# Patient Record
Sex: Male | Born: 1970 | Race: White | Hispanic: No | Marital: Married | Smoking: Current every day smoker
Health system: Southern US, Community
[De-identification: ages and names within clinical notes are randomized; demographics above are authoritative.]

## PROBLEM LIST (undated history)

## (undated) DIAGNOSIS — N200 Calculus of kidney: Secondary | ICD-10-CM

## (undated) HISTORY — PX: APPENDECTOMY: SHX54

## (undated) HISTORY — PX: ROTATOR CUFF REPAIR: SHX139

---

## 2017-01-02 ENCOUNTER — Emergency Department (HOSPITAL_BASED_OUTPATIENT_CLINIC_OR_DEPARTMENT_OTHER)
Admission: EM | Admit: 2017-01-02 | Discharge: 2017-01-02 | Disposition: A | Discharge status: Home / Self Care | Payer: Self-pay | Attending: Emergency Medicine | Admitting: Emergency Medicine

## 2017-01-02 ENCOUNTER — Encounter (HOSPITAL_BASED_OUTPATIENT_CLINIC_OR_DEPARTMENT_OTHER): Payer: Self-pay

## 2017-01-02 ENCOUNTER — Emergency Department (HOSPITAL_BASED_OUTPATIENT_CLINIC_OR_DEPARTMENT_OTHER): Payer: Self-pay

## 2017-01-02 DIAGNOSIS — Y999 Unspecified external cause status: Secondary | ICD-10-CM | POA: Insufficient documentation

## 2017-01-02 DIAGNOSIS — Y929 Unspecified place or not applicable: Secondary | ICD-10-CM | POA: Insufficient documentation

## 2017-01-02 DIAGNOSIS — W228XXA Striking against or struck by other objects, initial encounter: Secondary | ICD-10-CM | POA: Insufficient documentation

## 2017-01-02 DIAGNOSIS — F17210 Nicotine dependence, cigarettes, uncomplicated: Secondary | ICD-10-CM | POA: Insufficient documentation

## 2017-01-02 DIAGNOSIS — S62502A Fracture of unspecified phalanx of left thumb, initial encounter for closed fracture: Secondary | ICD-10-CM | POA: Insufficient documentation

## 2017-01-02 DIAGNOSIS — Y9389 Activity, other specified: Secondary | ICD-10-CM | POA: Insufficient documentation

## 2017-01-02 HISTORY — DX: Calculus of kidney: N20.0

## 2017-01-02 NOTE — Discharge Instructions (Signed)
Wear splint for comfort for the next several days, then slowly reintroduce activity.  Ice for 20 minutes every 2 hours while awake for the next 2 days.  Follow-up with hand surgery if not improving in the next week. The contact information for Dr. Merlyn LotKuzma has been provided in this discharge summary for you to call and make these arrangements.

## 2017-01-02 NOTE — ED Provider Notes (Signed)
[facility] Provider Note   CSN: [id number] Arrival date & time: 01/02/17  2107 By signing my name below, I, Levon Hedger, attest that this documentation has been prepared under the direction and in the presence of Geoffery Lyons, MD . Electronically Signed: Levon Hedger, Scribe. 01/02/2017. 11:24 PM.   History   Chief Complaint Chief Complaint  Patient presents with  . Finger Injury   HPI Nathaniel Solis is a 46 y.o. male who presents to the Emergency Department complaining of sudden onset, constant pain to left thumb after striking thumb with a hammer. Pt was working on his car when he accidentally struck his left thumb. Thumb pain is exacerbated by movement; he reports limited ROM due to pain. No alleviating or modifying factors noted.  Pt has no other acute complaints or associated symptoms at this time.    The history is provided by the patient. No language interpreter was used.   Past Medical History:  Diagnosis Date  . Kidney stone     There are no active problems to display for this patient.   Past Surgical History:  Procedure Laterality Date  . APPENDECTOMY      Home Medications    Prior to Admission medications   Not on File    Family History No family history on file.  Social History Social History  Substance Use Topics  . Smoking status: Current Every Day Smoker    Types: Cigarettes  . Smokeless tobacco: Never Used  . Alcohol use No    Allergies   Doxycycline  Review of Systems Review of Systems All systems reviewed and are negative for acute change except as noted in the HPI.  Physical Exam Updated Vital Signs BP 122/69 (BP Location: Left Arm)   Pulse 80   Temp 99.1 F (37.3 C) (Oral)   Resp 18   Ht 5\' 10"  (1.778 m)   Wt 188 lb (85.3 kg)   SpO2 98%   BMI 26.98 kg/m   Physical Exam  Constitutional: He is oriented to person, place, and time. He appears well-developed and well-nourished.  HENT:  Head: Normocephalic.  Eyes:  EOM are normal.  Neck: Normal range of motion.  Pulmonary/Chest: Effort normal.  Abdominal: He exhibits no distension.  Musculoskeletal: Normal range of motion.  Mild swelling and TTP over the MCP joint above the left thumb.Good ROM. Flexor and extensor tendons intact.   Neurological: He is alert and oriented to person, place, and time.  Psychiatric: He has a normal mood and affect.  Nursing note and vitals reviewed.  ED Treatments / Results  DIAGNOSTIC STUDIES:  Oxygen Saturation is 98% on RA, normal by my interpretation.    COORDINATION OF CARE:  11:25 PM Discussed treatment plan with pt at bedside and pt agreed to plan.   Labs (all labs ordered are listed, but only abnormal results are displayed) Labs Reviewed - No data to display  EKG  EKG Interpretation None       Radiology Dg Finger Thumb Left  Result Date: 01/02/2017 CLINICAL DATA:  Hammer injury to left thumb yesterday. Pain and swelling. EXAM: LEFT THUMB 2+V COMPARISON:  None. FINDINGS: No gross fracture. No subluxation or dislocation. The lateral view shows some cortical irregularity along the volar are base of the proximal phalanx. There are some degenerative changes in this joint in the findings could be related to spurring although a nondisplaced corner fracture at the base the proximal phalanx could potentially have this appearance. IMPRESSION: Possible tiny corner fracture  at the base of the proximal phalanx. This is seen on only one view in there are degenerative changes in this joint and the appearance could be related to spurring. Electronically Signed   By: Kennith CenterEric  Mansell M.D.   On: 01/02/2017 21:38    Procedures Procedures (including critical care time)  Medications Ordered in ED Medications - No data to display   Initial Impression / Assessment and Plan / ED Course  I have reviewed the triage vital signs and the nursing notes.  Pertinent labs & imaging results that were available during my care of the  patient were reviewed by me and considered in my medical decision making (see chart for details).  X-rays suggesting a small avulsion fracture, however no other acute findings. He will be placed in a thumb spica splint and is to follow-up with hand surgery if not improving in the next week.  Final Clinical Impressions(s) / ED Diagnoses   Final diagnoses:  None    New Prescriptions New Prescriptions   No medications on file   I personally performed the services described in this documentation, which was scribed in my presence. The recorded information has been reviewed and is accurate.        Geoffery Lyonselo, Dustine Bertini, MD 01/03/17 0200

## 2017-01-02 NOTE — ED Triage Notes (Signed)
Pt states he hit left thumb with hammer yesterday-no break in skin noted-NAD-steady gait

## 2018-03-14 ENCOUNTER — Other Ambulatory Visit: Payer: Self-pay

## 2018-03-14 ENCOUNTER — Emergency Department (HOSPITAL_BASED_OUTPATIENT_CLINIC_OR_DEPARTMENT_OTHER): Payer: Self-pay

## 2018-03-14 ENCOUNTER — Emergency Department (HOSPITAL_COMMUNITY): Payer: Self-pay

## 2018-03-14 ENCOUNTER — Emergency Department (HOSPITAL_BASED_OUTPATIENT_CLINIC_OR_DEPARTMENT_OTHER)
Admission: EM | Admit: 2018-03-14 | Discharge: 2018-03-14 | Disposition: A | Discharge status: Home / Self Care | Payer: Self-pay | Attending: Emergency Medicine | Admitting: Emergency Medicine

## 2018-03-14 ENCOUNTER — Encounter (HOSPITAL_BASED_OUTPATIENT_CLINIC_OR_DEPARTMENT_OTHER): Payer: Self-pay | Admitting: Emergency Medicine

## 2018-03-14 DIAGNOSIS — M25541 Pain in joints of right hand: Secondary | ICD-10-CM | POA: Insufficient documentation

## 2018-03-14 DIAGNOSIS — Y93H9 Activity, other involving exterior property and land maintenance, building and construction: Secondary | ICD-10-CM | POA: Insufficient documentation

## 2018-03-14 DIAGNOSIS — M7021 Olecranon bursitis, right elbow: Secondary | ICD-10-CM | POA: Insufficient documentation

## 2018-03-14 DIAGNOSIS — T1490XA Injury, unspecified, initial encounter: Secondary | ICD-10-CM

## 2018-03-14 DIAGNOSIS — W228XXA Striking against or struck by other objects, initial encounter: Secondary | ICD-10-CM | POA: Insufficient documentation

## 2018-03-14 DIAGNOSIS — Y999 Unspecified external cause status: Secondary | ICD-10-CM | POA: Insufficient documentation

## 2018-03-14 DIAGNOSIS — F17210 Nicotine dependence, cigarettes, uncomplicated: Secondary | ICD-10-CM | POA: Insufficient documentation

## 2018-03-14 MED ORDER — NAPROXEN 500 MG PO TABS: 500.0000 mg | ORAL_TABLET | Freq: Two times a day (BID) | ORAL | 0 refills | Status: DC

## 2018-03-14 MED ORDER — KETOROLAC TROMETHAMINE 30 MG/ML IJ SOLN
30.0000 mg | Freq: Once | INTRAMUSCULAR | Status: AC
  Administered 2018-03-14: 30 mg via INTRAMUSCULAR
  Filled 2018-03-14: qty 1

## 2018-03-14 NOTE — ED Notes (Signed)
Patient transported to X-ray 

## 2018-03-14 NOTE — Discharge Instructions (Signed)
Return to ED for worsening symptoms, worsening swelling, additional injuries or falls, red hot or tender joint with fever or trouble moving your arm.

## 2018-03-14 NOTE — ED Provider Notes (Signed)
[facility] EMERGENCY DEPARTMENT Provider Note   CSN: [id number] Arrival date & time: 03/14/18  7829     History   Chief Complaint Chief Complaint  Patient presents with  . Hand Injury  . Elbow Pain    HPI Nathaniel Solis is a 47 y.o. male who presents to ED for evaluation of 3-week history of right elbow pain and right third digit MCP joint pain.  Symptoms began when he injured himself on his lawnmower 3 weeks ago.  States that he actually went over a sink hole.  He has had progressive worsening pain and swelling since then.  He has not been taking any medication at home to help with the symptoms.  Denies any history of gout or septic joint, fever, additional injuries or falls, numbness in arms or legs, other injuries.  HPI  Past Medical History:  Diagnosis Date  . Kidney stone     There are no active problems to display for this patient.   Past Surgical History:  Procedure Laterality Date  . APPENDECTOMY          Home Medications    Prior to Admission medications   Medication Sig Start Date End Date Taking? Authorizing Provider  naproxen (NAPROSYN) 500 MG tablet Take 1 tablet (500 mg total) by mouth 2 (two) times daily. 03/14/18   Dietrich Pates, PA-C    Family History History reviewed. No pertinent family history.  Social History Social History   Tobacco Use  . Smoking status: Current Every Day Smoker    Types: Cigarettes  . Smokeless tobacco: Never Used  Substance Use Topics  . Alcohol use: No  . Drug use: No     Allergies   Doxycycline   Review of Systems Review of Systems  Constitutional: Negative for chills and fever.  Musculoskeletal: Positive for arthralgias and joint swelling. Negative for back pain, gait problem, myalgias, neck pain and neck stiffness.  Neurological: Negative for weakness and numbness.     Physical Exam Updated Vital Signs BP (!) 153/88 (BP Location: Left Arm)   Pulse 78   Temp 98 F (36.7 C) (Oral)   Resp  16   Ht 5\' 10"  (1.778 m)   Wt 86.2 kg   SpO2 98%   BMI 27.26 kg/m   Physical Exam  Constitutional: He appears well-developed and well-nourished. No distress.  HENT:  Head: Normocephalic and atraumatic.  Eyes: Conjunctivae and EOM are normal. No scleral icterus.  Neck: Normal range of motion.  Pulmonary/Chest: Effort normal. No respiratory distress.  Musculoskeletal: Normal range of motion. He exhibits edema. He exhibits no tenderness or deformity.  Full active and passive range of motion of right elbow, wrist and digits. Edema noted of the R elbow (at olecranon) with no overlying skin changes or warmth noted.  2+ radial pulse palpated.  Neurological: He is alert.  Skin: No rash noted. He is not diaphoretic.  Psychiatric: He has a normal mood and affect.  Nursing note and vitals reviewed.    ED Treatments / Results  Labs (all labs ordered are listed, but only abnormal results are displayed) Labs Reviewed - No data to display  EKG None  Radiology Dg Elbow Complete Right (3+view)  Result Date: 03/14/2018 CLINICAL DATA:  Pain after trauma 3 weeks ago EXAM: RIGHT ELBOW - COMPLETE 3+ VIEW COMPARISON:  None. FINDINGS: Swelling over the olecranon. No underlying fracture. No joint effusion. No other abnormalities. IMPRESSION: Probable olecranon bursitis with soft tissue swelling in this region. No fractures.  Electronically Signed   By: Gerome Sam III M.D   On: 03/14/2018 10:54   Dg Hand Complete Right  Result Date: 03/14/2018 CLINICAL DATA:  Trauma to elbow 3 weeks ago with continued pain and swelling. Old trauma to index finger. Pain in the third and fourth MCP joints. EXAM: RIGHT HAND - COMPLETE 3+ VIEW COMPARISON:  None. FINDINGS: Amputation of the distal index finger. Degenerative changes at the first interphalangeal joint. Mild bowing of the fifth metacarpal may represent sequela of remote trauma. No acute fractures are seen. IMPRESSION: No acute fractures.  Distal index finger  remote amputation. Electronically Signed   By: Gerome Sam III M.D   On: 03/14/2018 10:53    Procedures Procedures (including critical care time)  Medications Ordered in ED Medications  ketorolac (TORADOL) 30 MG/ML injection 30 mg (has no administration in time range)     Initial Impression / Assessment and Plan / ED Course  I have reviewed the triage vital signs and the nursing notes.  Pertinent labs & imaging results that were available during my care of the patient were reviewed by me and considered in my medical decision making (see chart for details).     47 year old male presents to ED for 3-week history of right elbow pain and hand pain.  He was on his lawnmower when he went into a syncopal and bumped his hand and elbow on the side of the lawnmower.  He has had pain and swelling since then.  He has not taken any medicine help with his symptoms.  No prior injuries noted.  On exam there is edema of the olecranon bursa with no overlying skin changes or changes to range of motion noted.  Tenderness palpation over the third MCP joint of the right hand.  No skin changes noted and here as well.  X-ray shows olecranon bursitis of the right elbow and negative right hand x-ray.  Will give patient anti-inflammatories, sling for comfort and orthopedic follow-up.  I do not feel that there is any infectious or vascular cause of his symptoms.  Will advise him to return to ED for any severe worsening symptoms.  Portions of this note were generated with Scientist, clinical (histocompatibility and immunogenetics). Dictation errors may occur despite best attempts at proofreading.   Final Clinical Impressions(s) / ED Diagnoses   Final diagnoses:  Olecranon bursitis of right elbow    ED Discharge Orders         Ordered    naproxen (NAPROSYN) 500 MG tablet  2 times daily     03/14/18 1116           Dietrich Pates, PA-C 03/14/18 1116    Azalia Bilis, MD 03/14/18 1405

## 2018-03-14 NOTE — ED Triage Notes (Signed)
Patient states that he hit his right elbow and hand about 3 weeks ago and now has continued pain and swelling

## 2018-07-26 ENCOUNTER — Other Ambulatory Visit: Payer: Self-pay

## 2018-07-26 ENCOUNTER — Encounter (HOSPITAL_BASED_OUTPATIENT_CLINIC_OR_DEPARTMENT_OTHER): Payer: Self-pay | Admitting: *Deleted

## 2018-07-26 ENCOUNTER — Emergency Department (HOSPITAL_BASED_OUTPATIENT_CLINIC_OR_DEPARTMENT_OTHER)
Admission: EM | Admit: 2018-07-26 | Discharge: 2018-07-26 | Disposition: A | Discharge status: Home / Self Care | Payer: Medicaid Other | Attending: Emergency Medicine | Admitting: Emergency Medicine

## 2018-07-26 DIAGNOSIS — F17210 Nicotine dependence, cigarettes, uncomplicated: Secondary | ICD-10-CM | POA: Insufficient documentation

## 2018-07-26 DIAGNOSIS — L989 Disorder of the skin and subcutaneous tissue, unspecified: Secondary | ICD-10-CM | POA: Insufficient documentation

## 2018-07-26 DIAGNOSIS — Z79899 Other long term (current) drug therapy: Secondary | ICD-10-CM | POA: Insufficient documentation

## 2018-07-26 NOTE — Discharge Instructions (Signed)
Please try over the counter wart removal Please establish care with a primary doctor

## 2018-07-26 NOTE — ED Provider Notes (Signed)
[facility] EMERGENCY DEPARTMENT Provider Note   CSN: [id number] Arrival date & time: 07/26/18  1936     History   Chief Complaint Chief Complaint  Patient presents with  . Nevus    HPI Nathaniel Solis is a 48 y.o. male who presents with a skin lesion.  No significant past medical history.  Patient states that he developed a painful skin lesion on the right middle aspect of his back several weeks ago.  The area is tender.  He thought it might of been a boil that needs to be lanced.  He had his significant other look at it and was told that it looks like a mole.  He came here for an evaluation because he does not know what it is.  It just popped up over the past couple weeks. He reports family hx of skin cancer. Nothing makes it better or worse.  HPI  Past Medical History:  Diagnosis Date  . Kidney stone     There are no active problems to display for this patient.   Past Surgical History:  Procedure Laterality Date  . APPENDECTOMY          Home Medications    Prior to Admission medications   Medication Sig Start Date End Date Taking? Authorizing Provider  naproxen (NAPROSYN) 500 MG tablet Take 1 tablet (500 mg total) by mouth 2 (two) times daily. 03/14/18   Dietrich Pates, PA-C    Family History No family history on file.  Social History Social History   Tobacco Use  . Smoking status: Current Every Day Smoker    Types: Cigarettes  . Smokeless tobacco: Never Used  Substance Use Topics  . Alcohol use: No  . Drug use: No     Allergies   Doxycycline   Review of Systems Review of Systems  Constitutional: Negative for fever.  Skin:       +lesion     Physical Exam Updated Vital Signs BP 122/76 (BP Location: Right Arm)   Pulse 78   Temp 98.2 F (36.8 C) (Oral)   Resp 16   Ht 5\' 10"  (1.778 m)   Wt 93 kg   SpO2 98%   BMI 29.41 kg/m   Physical Exam Vitals signs and nursing note reviewed.  Constitutional:      General: He is not in  acute distress.    Appearance: Normal appearance. He is well-developed.     Comments: Chronically ill-appearing.  Appears older than stated age  HENT:     Head: Normocephalic and atraumatic.  Eyes:     General: No scleral icterus.       Right eye: No discharge.        Left eye: No discharge.     Conjunctiva/sclera: Conjunctivae normal.     Pupils: Pupils are equal, round, and reactive to light.  Neck:     Musculoskeletal: Normal range of motion.  Cardiovascular:     Rate and Rhythm: Normal rate.  Pulmonary:     Effort: Pulmonary effort is normal. No respiratory distress.  Abdominal:     General: There is no distension.  Skin:    General: Skin is warm and dry.     Comments: .5cm round, brown lesion consistent with wart vs irritated mole  Neurological:     Mental Status: He is alert and oriented to person, place, and time.  Psychiatric:        Behavior: Behavior normal.      ED Treatments /  Results  Labs (all labs ordered are listed, but only abnormal results are displayed) Labs Reviewed - No data to display  EKG None  Radiology No results found.  Procedures Procedures (including critical care time)  Medications Ordered in ED Medications - No data to display   Initial Impression / Assessment and Plan / ED Course  I have reviewed the triage vital signs and the nursing notes.  Pertinent labs & imaging results that were available during my care of the patient were reviewed by me and considered in my medical decision making (see chart for details).  48 year old male presents with a skin lesion for the past couple of weeks.  It is tender and is bothering him.  He has several other moles on his back which do not look similar.  I think most likely it is a wart.  Possibly irritated mole.  He was encouraged to try over-the-counter wart medication and follow-up with a primary care provider.  Final Clinical Impressions(s) / ED Diagnoses   Final diagnoses:  Skin lesion     ED Discharge Orders    None       Bethel BornGekas, Othella Slappey Marie, PA-C 07/27/18 0001    Alvira MondaySchlossman, Erin, MD 07/29/18 [phone]

## 2018-07-26 NOTE — ED Triage Notes (Signed)
He has a mole on his back that hurts.

## 2019-03-07 ENCOUNTER — Emergency Department (HOSPITAL_BASED_OUTPATIENT_CLINIC_OR_DEPARTMENT_OTHER)
Admission: EM | Admit: 2019-03-07 | Discharge: 2019-03-07 | Disposition: A | Discharge status: Home / Self Care | Payer: Medicaid Other | Attending: Emergency Medicine | Admitting: Emergency Medicine

## 2019-03-07 ENCOUNTER — Encounter (HOSPITAL_BASED_OUTPATIENT_CLINIC_OR_DEPARTMENT_OTHER): Payer: Self-pay | Admitting: *Deleted

## 2019-03-07 ENCOUNTER — Emergency Department (HOSPITAL_BASED_OUTPATIENT_CLINIC_OR_DEPARTMENT_OTHER): Payer: Medicaid Other

## 2019-03-07 ENCOUNTER — Other Ambulatory Visit: Payer: Self-pay

## 2019-03-07 DIAGNOSIS — Y929 Unspecified place or not applicable: Secondary | ICD-10-CM | POA: Diagnosis not present

## 2019-03-07 DIAGNOSIS — W01198A Fall on same level from slipping, tripping and stumbling with subsequent striking against other object, initial encounter: Secondary | ICD-10-CM | POA: Insufficient documentation

## 2019-03-07 DIAGNOSIS — F17210 Nicotine dependence, cigarettes, uncomplicated: Secondary | ICD-10-CM | POA: Insufficient documentation

## 2019-03-07 DIAGNOSIS — S0990XA Unspecified injury of head, initial encounter: Secondary | ICD-10-CM

## 2019-03-07 DIAGNOSIS — Z79899 Other long term (current) drug therapy: Secondary | ICD-10-CM | POA: Diagnosis not present

## 2019-03-07 DIAGNOSIS — Y9389 Activity, other specified: Secondary | ICD-10-CM | POA: Diagnosis not present

## 2019-03-07 DIAGNOSIS — S060X1A Concussion with loss of consciousness of 30 minutes or less, initial encounter: Secondary | ICD-10-CM | POA: Diagnosis not present

## 2019-03-07 DIAGNOSIS — Y999 Unspecified external cause status: Secondary | ICD-10-CM | POA: Diagnosis not present

## 2019-03-07 NOTE — ED Triage Notes (Signed)
He was working on a car when he tripped and fell. He hit the right side of his head. Positive LOC. He has a headache and a hematoma.

## 2019-03-07 NOTE — ED Notes (Signed)
Pt. Reports he had a fall today into a concrete pole hitting the R side of his head.  Pt. Reports the fall happened at approx. 5:30 and he did loose consciousness for approx. A min.  Pt. Is acutely aware of how this happened and has GCS of 15 at present time.  Wife at bedside.

## 2019-03-07 NOTE — Discharge Instructions (Signed)
Your imaging today showed no acute fracture, intracranial hemorrhage, or other abnormality.  We suspect you have a concussion based on the injury.  Please rest and stay hydrated.  Please follow-up with your primary doctor.  If any symptoms change or worsen, please return to the nearest emergency department.

## 2019-03-07 NOTE — ED Notes (Signed)
Pt. Standing at the door asking about his discharge papers.   Pt. States he is ready to leave.

## 2019-03-07 NOTE — ED Provider Notes (Signed)
[name] EMERGENCY DEPARTMENT Provider Note   CSN: [id number] Arrival date & time: 03/07/19  2027     History   Chief Complaint Chief Complaint  Patient presents with  . Fall  . Head Injury    HPI Nathaniel Solis is a 48 y.o. male who presents for evaluation of right-sided head pain after mechanical fall that occurred about 5 PM this evening.  Patient reports that he was working on a car and stepped up to go do something and reports that he tripped, causing him to fall.  He reports he hit his right side of his head on a concrete pole.  He states that he had loss of consciousness for approximately a minute.  Patient states he is not currently on blood thinners.  He states that he is not any numbness/weakness, difficulty walking, nausea/vomiting since the incident.  Patient states that he started having a headache on the right side of the head and felt tenderness palpation noted so he came to the emergency department.  He denies any vision changes, numbness/weakness of his arms or legs, preceding chest pain or dizziness, neck pain, vomiting.     The history is provided by the patient.    Past Medical History:  Diagnosis Date  . Kidney stone     There are no active problems to display for this patient.   Past Surgical History:  Procedure Laterality Date  . APPENDECTOMY          Home Medications    Prior to Admission medications   Medication Sig Start Date End Date Taking? Authorizing Provider  Methocarbamol (ROBAXIN PO) Take by mouth.   Yes [provider]  naproxen (NAPROSYN) 500 MG tablet Take 1 tablet (500 mg total) by mouth 2 (two) times daily. 03/14/18  Yes Khatri, Hina, PA-C    Family History No family history on file.  Social History Social History   Tobacco Use  . Smoking status: Current Every Day Smoker    Types: Cigarettes  . Smokeless tobacco: Never Used  Substance Use Topics  . Alcohol use: No  . Drug use: No     Allergies    Doxycycline   Review of Systems Review of Systems  Constitutional: Negative for fever.  Eyes: Negative for visual disturbance.  Respiratory: Negative for cough and shortness of breath.   Cardiovascular: Negative for chest pain.  Gastrointestinal: Negative for abdominal pain, nausea and vomiting.  Musculoskeletal: Negative for neck pain.  Neurological: Positive for headaches. Negative for dizziness, weakness and numbness.  All other systems reviewed and are negative.    Physical Exam Updated Vital Signs BP 131/90 (BP Location: Left Arm)   Pulse 81   Temp 98.8 F (37.1 C) (Oral)   Resp 16   Ht 5\' 10"  (1.778 m)   Wt 86.2 kg   SpO2 98%   BMI 27.26 kg/m   Physical Exam Vitals signs and nursing note reviewed.  Constitutional:      Appearance: Normal appearance. He is well-developed.  HENT:     Head: Normocephalic and atraumatic.      Comments: Tenderness palpation noted to right temporal region.  There is small area that it feels slightly depressed.  There is an overlying hematoma noted.  No laceration or wound. Eyes:     General: Lids are normal.     Conjunctiva/sclera: Conjunctivae normal.     Pupils: Pupils are equal, round, and reactive to light.     Comments: PERRL. EOMs intact. No nystagmus.  No neglect.   Neck:     Musculoskeletal: Full passive range of motion without pain.     Comments: Full flexion/extension and lateral movement of neck fully intact. No bony midline tenderness. No deformities or crepitus.  Cardiovascular:     Rate and Rhythm: Normal rate and regular rhythm.     Pulses: Normal pulses.     Heart sounds: Normal heart sounds. No murmur. No friction rub. No gallop.   Pulmonary:     Effort: Pulmonary effort is normal.     Breath sounds: Normal breath sounds.  Abdominal:     Palpations: Abdomen is soft. Abdomen is not rigid.     Tenderness: There is no abdominal tenderness. There is no guarding.  Musculoskeletal: Normal range of motion.  Skin:     General: Skin is warm and dry.     Capillary Refill: Capillary refill takes less than 2 seconds.  Neurological:     Mental Status: He is alert and oriented to person, place, and time.     Comments: Cranial nerves III-XII intact Follows commands, Moves all extremities  5/5 strength to BUE and BLE  Sensation intact throughout all major nerve distributions Normal coordination No pronator drift. No gait abnormalities  No slurred speech. No facial droop.   Psychiatric:        Speech: Speech normal.      ED Treatments / Results  Labs (all labs ordered are listed, but only abnormal results are displayed) Labs Reviewed - No data to display  EKG None  Radiology Ct Head Wo Contrast  Result Date: 03/07/2019 CLINICAL DATA:  Head trauma, headache EXAM: CT HEAD WITHOUT CONTRAST TECHNIQUE: Contiguous axial images were obtained from the base of the skull through the vertex without intravenous contrast. COMPARISON:  None. FINDINGS: Brain: No acute intracranial abnormality. Specifically, no hemorrhage, hydrocephalus, mass lesion, acute infarction, or significant intracranial injury. Vascular: No hyperdense vessel or unexpected calcification. Skull: No acute calvarial abnormality. Sinuses/Orbits: Visualized paranasal sinuses and mastoids clear. Orbital soft tissues unremarkable. Other: None IMPRESSION: Normal study. Electronically Signed   By: Charlett NoseKevin  Dover M.D.   On: 03/07/2019 21:59    Procedures Procedures (including critical care time)  Medications Ordered in ED Medications - No data to display   Initial Impression / Assessment and Plan / ED Course  I have reviewed the triage vital signs and the nursing notes.  Pertinent labs & imaging results that were available during my care of the patient were reviewed by me and considered in my medical decision making (see chart for details).        48 year old male who presents for evaluation of head injury after mechanical fall.  Reports that  he tripped and hit his head on a concrete pole.  Positive LOC.  No preceding chest pain or dizziness.  Reports pain to right side of his head since then.  He is not currently on blood thinners.  Denies any numbness/weakness, nausea/vomiting. Patient is afebrile, non-toxic appearing, sitting comfortably on examination table. Vital signs reviewed and stable.  No neuro deficits noted on exam.  He does have tenderness palpation of the right temporal region with a small indentation.  Will plan for CT head given LOC and tenderness palpation of the right temple region.  Patient signed out to Dr. Rush Landmarkegeler with head CT pending.  Portions of this note were generated with Scientist, clinical (histocompatibility and immunogenetics)Dragon dictation software. Dictation errors may occur despite best attempts at proofreading.   Final Clinical Impressions(s) / ED Diagnoses   Final  diagnoses:  Injury of head, initial encounter  Concussion with loss of consciousness of 30 minutes or less, initial encounter    ED Discharge Orders    None       Rosana Hoes 03/08/19 2210    Tegeler, Canary Brim, MD 03/09/19 1349

## 2021-02-21 ENCOUNTER — Emergency Department (HOSPITAL_BASED_OUTPATIENT_CLINIC_OR_DEPARTMENT_OTHER)
Admission: EM | Admit: 2021-02-21 | Discharge: 2021-02-21 | Disposition: A | Discharge status: Home / Self Care | Payer: Medicaid Other | Attending: Emergency Medicine | Admitting: Emergency Medicine

## 2021-02-21 ENCOUNTER — Other Ambulatory Visit: Payer: Self-pay

## 2021-02-21 ENCOUNTER — Encounter (HOSPITAL_BASED_OUTPATIENT_CLINIC_OR_DEPARTMENT_OTHER): Payer: Self-pay | Admitting: *Deleted

## 2021-02-21 ENCOUNTER — Emergency Department (HOSPITAL_BASED_OUTPATIENT_CLINIC_OR_DEPARTMENT_OTHER): Payer: Medicaid Other

## 2021-02-21 DIAGNOSIS — R29810 Facial weakness: Secondary | ICD-10-CM | POA: Diagnosis not present

## 2021-02-21 DIAGNOSIS — F17210 Nicotine dependence, cigarettes, uncomplicated: Secondary | ICD-10-CM | POA: Diagnosis not present

## 2021-02-21 DIAGNOSIS — H509 Unspecified strabismus: Secondary | ICD-10-CM | POA: Diagnosis not present

## 2021-02-21 DIAGNOSIS — H5000 Unspecified esotropia: Secondary | ICD-10-CM

## 2021-02-21 NOTE — Discharge Instructions (Addendum)
Follow-up with your primary care doctor for recheck.  Your CT today is normal.

## 2021-02-21 NOTE — ED Notes (Signed)
Patient reports brief episode of facial numbness and "crossed eyes" ~ 1245 today, now resolved.  Denies dizziness, headache, weakness, vision changes or any other neurological symptoms.

## 2021-02-21 NOTE — ED Provider Notes (Signed)
[facility] EMERGENCY DEPARTMENT Provider Note   CSN: [id number] Arrival date & time: 02/21/21  1338     History Chief Complaint  Patient presents with   Numbness    Nathaniel Solis is a 50 y.o. male.  50 year old male with remote history of prostate mass, removed, urethral stricture with foley placed yesterday present today with feeling jittery with report of brief episode of "eye jumping with crossing of the left eye" followed by left side facial numbness.  Episode lasted a few seconds and completely resolved.  Denies difficulty with speech or gait.  Patient was able to talk to his significant other throughout the event, she is at bedside and reports "crossing of his left eye and jumping of the right eye." Patient went his urologist office this morning, prior to this episode for obstructed foley, had uncomplicated irrigation.      Past Medical History:  Diagnosis Date   Kidney stone     There are no problems to display for this patient.   Past Surgical History:  Procedure Laterality Date   APPENDECTOMY         No family history on file.  Social History   Tobacco Use   Smoking status: Every Day    Packs/day: 0.50    Types: Cigarettes   Smokeless tobacco: Never  Substance Use Topics   Alcohol use: No   Drug use: No    Home Medications Prior to Admission medications   Medication Sig Start Date End Date Taking? Authorizing Provider  Methocarbamol (ROBAXIN PO) Take by mouth.    [provider]  naproxen (NAPROSYN) 500 MG tablet Take 1 tablet (500 mg total) by mouth 2 (two) times daily. 03/14/18   Khatri, Hina, PA-C    Allergies    Doxycycline  Review of Systems   Review of Systems  Constitutional:  Negative for fever.  Eyes:  Negative for visual disturbance.  Gastrointestinal:  Negative for nausea and vomiting.  Musculoskeletal:  Negative for arthralgias, gait problem and myalgias.  Skin:  Negative for rash and wound.   Allergic/Immunologic: Negative for immunocompromised state.  Neurological:  Positive for numbness. Negative for dizziness, weakness and headaches.  Psychiatric/Behavioral:  Negative for confusion.   All other systems reviewed and are negative.  Physical Exam Updated Vital Signs BP 109/80 (BP Location: Right Arm)   Pulse (!) 56   Temp 98.6 F (37 C) (Oral)   Resp 18   Ht 5\' 10"  (1.778 m)   Wt 86.2 kg   SpO2 96%   BMI 27.26 kg/m   Physical Exam Vitals and nursing note reviewed.  Constitutional:      General: He is not in acute distress.    Appearance: He is well-developed. He is not diaphoretic.  HENT:     Head: Normocephalic and atraumatic.     Nose: Nose normal.     Mouth/Throat:     Mouth: Mucous membranes are moist.     Pharynx: Oropharynx is clear.  Eyes:     Extraocular Movements: Extraocular movements intact.     Pupils: Pupils are equal, round, and reactive to light.  Cardiovascular:     Rate and Rhythm: Normal rate and regular rhythm.     Heart sounds: Normal heart sounds.  Pulmonary:     Effort: Pulmonary effort is normal.     Breath sounds: Normal breath sounds.  Musculoskeletal:     Cervical back: Neck supple.     Right lower leg: No edema.  Left lower leg: No edema.  Skin:    General: Skin is warm and dry.     Findings: No erythema or rash.  Neurological:     Mental Status: He is alert and oriented to person, place, and time.     GCS: GCS eye subscore is 4. GCS verbal subscore is 5. GCS motor subscore is 6.     Cranial Nerves: No cranial nerve deficit.     Sensory: Sensation is intact. No sensory deficit.     Motor: Motor function is intact. No weakness or pronator drift.     Coordination: Coordination normal. Heel to Shin Test normal.  Psychiatric:        Behavior: Behavior normal.    ED Results / Procedures / Treatments   Labs (all labs ordered are listed, but only abnormal results are displayed) Labs Reviewed - No data to  display  EKG None  Radiology CT Head Wo Contrast  Result Date: 02/21/2021 CLINICAL DATA:  TIA EXAM: CT HEAD WITHOUT CONTRAST TECHNIQUE: Contiguous axial images were obtained from the base of the skull through the vertex without intravenous contrast. COMPARISON:  CT head 03/07/2019 FINDINGS: Brain: There is no acute intracranial hemorrhage, extra-axial fluid collection, or infarct. The ventricles are normal in size. There is no mass lesion. There is no midline shift. Vascular: No hyperdense vessel or unexpected calcification. Skull: Normal. Negative for fracture or focal lesion. Sinuses/Orbits: There is a small retention cyst in the right sphenoid sinus. A defect in the right lamina papyracea likely reflects prior trauma. The imaged globes and orbits are unremarkable. Other: None. IMPRESSION: No acute intracranial pathology. Electronically Signed   By: Lesia Hausen M.D.   On: 02/21/2021 15:45    Procedures Procedures   Medications Ordered in ED Medications - No data to display  ED Course  I have reviewed the triage vital signs and the nursing notes.  Pertinent labs & imaging results that were available during my care of the patient were reviewed by me and considered in my medical decision making (see chart for details).  Clinical Course as of 02/21/21 1615  Thu Feb 21, 2021  [id number] 50 year old male presents with concern for a brief episode of eyes crossing and numbness to the left side of his face.  Exam normal at this time.  CT head is unremarkable.  Case discussed with Dr. Delford Field, ER attending, patient discharged to follow-up with primary care provider with return to ER precautions. [LM]    Clinical Course User Index [LM] Alden Hipp   MDM Rules/Calculators/A&P                           Final Clinical Impression(s) / ED Diagnoses Final diagnoses:  Cross-eye    Rx / DC Orders ED Discharge Orders     None        Jeannie Fend, PA-C 02/21/21 1615    Koleen Distance, MD 02/22/21 0730

## 2021-02-21 NOTE — ED Triage Notes (Signed)
C/o brief episode of left facial  numbness 1 hr ago lasting 4 mins

## 2021-03-11 ENCOUNTER — Encounter (HOSPITAL_BASED_OUTPATIENT_CLINIC_OR_DEPARTMENT_OTHER): Payer: Self-pay

## 2021-03-11 ENCOUNTER — Emergency Department (HOSPITAL_BASED_OUTPATIENT_CLINIC_OR_DEPARTMENT_OTHER): Payer: Medicaid Other

## 2021-03-11 ENCOUNTER — Emergency Department (HOSPITAL_BASED_OUTPATIENT_CLINIC_OR_DEPARTMENT_OTHER)
Admission: EM | Admit: 2021-03-11 | Discharge: 2021-03-11 | Disposition: A | Discharge status: Home / Self Care | Payer: Medicaid Other | Attending: Emergency Medicine | Admitting: Emergency Medicine

## 2021-03-11 ENCOUNTER — Other Ambulatory Visit: Payer: Self-pay

## 2021-03-11 DIAGNOSIS — W11XXXA Fall on and from ladder, initial encounter: Secondary | ICD-10-CM | POA: Insufficient documentation

## 2021-03-11 DIAGNOSIS — J449 Chronic obstructive pulmonary disease, unspecified: Secondary | ICD-10-CM | POA: Diagnosis not present

## 2021-03-11 DIAGNOSIS — M79641 Pain in right hand: Secondary | ICD-10-CM | POA: Diagnosis not present

## 2021-03-11 DIAGNOSIS — M25511 Pain in right shoulder: Secondary | ICD-10-CM | POA: Diagnosis present

## 2021-03-11 DIAGNOSIS — F17210 Nicotine dependence, cigarettes, uncomplicated: Secondary | ICD-10-CM | POA: Insufficient documentation

## 2021-03-11 DIAGNOSIS — M25531 Pain in right wrist: Secondary | ICD-10-CM | POA: Insufficient documentation

## 2021-03-11 DIAGNOSIS — W19XXXA Unspecified fall, initial encounter: Secondary | ICD-10-CM

## 2021-03-11 MED ORDER — KETOROLAC TROMETHAMINE 15 MG/ML IJ SOLN
15.0000 mg | Freq: Once | INTRAMUSCULAR | Status: AC
  Administered 2021-03-11: 15 mg via INTRAVENOUS
  Filled 2021-03-11: qty 1

## 2021-03-11 NOTE — ED Notes (Signed)
ED Provider at bedside. 

## 2021-03-11 NOTE — Discharge Instructions (Signed)
You were seen here today for right shoulder, wrist, and hand pain.  A referral to an orthopedic provider has been attached to the discharge paperwork you may take ibuprofen 800 mg every 8 hours as needed for pain.  Please rotate between heat and ice for pain and swelling.  The RICE method has been attached to this document.  If you have any concerns, or new or worsening symptoms, please return to the emergency department.

## 2021-03-11 NOTE — ED Triage Notes (Signed)
Pt reports he fell from a ladder yesterday-pain to right shoulder and right hand-NAD-steady gait-NAD

## 2021-03-11 NOTE — ED Provider Notes (Signed)
[facility] EMERGENCY DEPARTMENT Provider Note   CSN: [id number] Arrival date & time: 03/11/21  1438     History Chief Complaint  Patient presents with   Nathaniel Solis is a 50 y.o. male history of COPD presents to the ED complaining of right shoulder, wrist, hand pain after falling off a 5 foot ladder yesterday around 1900.  The patient mentions he fell backwards with wrist extended then landing on right shoulder . He reports that pain feels like a "bruise" with occasional sharpness.  Patient denies any blood thinner use or head injury.  He denies any neck or back pain.  Denies any chest pain or shortness of breath.  Denies any numbness, tingling, or weakness.  History includes left shoulder repair.  Daily medications include tizanidine and Lyrica.  He is a daily smoker, but denies any EtOH or drug use ever.  Allergic to doxycycline.   Fall Pertinent negatives include no chest pain, no abdominal pain and no shortness of breath.      Past Medical History:  Diagnosis Date   Kidney stone     There are no problems to display for this patient.   Past Surgical History:  Procedure Laterality Date   APPENDECTOMY         No family history on file.  Social History   Tobacco Use   Smoking status: Every Day    Packs/day: 0.50    Types: Cigarettes   Smokeless tobacco: Never  Vaping Use   Vaping Use: Never used  Substance Use Topics   Alcohol use: No   Drug use: No    Home Medications Prior to Admission medications   Medication Sig Start Date End Date Taking? Authorizing Provider  Methocarbamol (ROBAXIN PO) Take by mouth.    [provider]  naproxen (NAPROSYN) 500 MG tablet Take 1 tablet (500 mg total) by mouth 2 (two) times daily. 03/14/18   Khatri, Hina, PA-C    Allergies    Doxycycline  Review of Systems   Review of Systems  Constitutional:  Negative for chills and fever.  HENT:  Negative for ear pain and sore throat.   Eyes:   Negative for pain and visual disturbance.  Respiratory:  Negative for cough and shortness of breath.   Cardiovascular:  Negative for chest pain and palpitations.  Gastrointestinal:  Negative for abdominal pain and vomiting.  Genitourinary:  Negative for dysuria and hematuria.  Musculoskeletal:  Positive for arthralgias and myalgias. Negative for back pain and neck pain.  Skin:  Negative for color change and rash.  Neurological:  Negative for seizures, syncope, weakness and numbness.  All other systems reviewed and are negative.  Physical Exam Updated Vital Signs BP 139/80 (BP Location: Left Arm)   Pulse 71   Temp 98.9 F (37.2 C) (Oral)   Resp 18   Ht 5\' 10"  (1.778 m)   Wt 89.8 kg   SpO2 98%   BMI 28.41 kg/m   Physical Exam Vitals and nursing note reviewed.  Constitutional:      Appearance: Normal appearance.  HENT:     Head: Normocephalic and atraumatic.  Eyes:     General: No scleral icterus. Neck:     Comments: No midline C-spine tenderness.  No deformities or step-offs noted. Cardiovascular:     Rate and Rhythm: Normal rate and regular rhythm.     Comments: Radial pulses 2+ bilaterally.  Good cap refill. Pulmonary:     Effort: Pulmonary effort is  normal. No respiratory distress.     Breath sounds: Normal breath sounds.  Abdominal:     General: Abdomen is flat.     Palpations: Abdomen is soft.  Musculoskeletal:        General: Swelling and tenderness present. No deformity.     Cervical back: Normal range of motion. No tenderness.     Comments: Right - Patient has mild swelling to dorsum of right hand.  Tenderness over third knuckle with no signs of deformity.  No snuffbox tenderness.  Patient is able to flex and extend wrist, flex and extend elbow.  Range of motion of shoulder limited secondary to pain.  Empty can test positive.  Hawkins positive.  Patient has 2+ radial pulses bilaterally.  Sensation intact bilaterally.  No obvious deformities to forearm, elbow, or  shoulder.  The patient is missing his distal phalanx on his right index finger from injury 20+ years ago.  No midline or paraspinal tenderness to thoracic, lumbar, or sacral spine.  Skin:    General: Skin is warm and dry.  Neurological:     General: No focal deficit present.     Mental Status: He is alert. Mental status is at baseline.    ED Results / Procedures / Treatments   Labs (all labs ordered are listed, but only abnormal results are displayed) Labs Reviewed - No data to display  EKG None  Radiology DG Shoulder Right  Result Date: 03/11/2021 CLINICAL DATA:  Larey Seat off ladder EXAM: RIGHT SHOULDER - 2+ VIEW COMPARISON:  None. FINDINGS: There is no evidence of fracture or dislocation. There is no evidence of arthropathy or other focal bone abnormality. Soft tissues are unremarkable. IMPRESSION: Negative. Electronically Signed   By: Jasmine Pang M.D.   On: 03/11/2021 16:56   DG Elbow Complete Right  Result Date: 03/11/2021 CLINICAL DATA:  Fall from ladder yesterday. EXAM: RIGHT ELBOW - COMPLETE 3+ VIEW COMPARISON:  03/14/2018 FINDINGS: No acute fracture or dislocation. No joint effusion. Tiny enthesophyte at the triceps insertion. IMPRESSION: No acute osseous abnormality. Electronically Signed   By: Jeronimo Greaves M.D.   On: 03/11/2021 16:57   DG Wrist Complete Right  Result Date: 03/11/2021 CLINICAL DATA:  Larey Seat off ladder EXAM: RIGHT WRIST - COMPLETE 3+ VIEW COMPARISON:  None. FINDINGS: There is no evidence of fracture or dislocation. There is no evidence of arthropathy or other focal bone abnormality. Soft tissues are unremarkable. IMPRESSION: Negative. Electronically Signed   By: Jasmine Pang M.D.   On: 03/11/2021 16:57   DG Hand Complete Right  Result Date: 03/11/2021 CLINICAL DATA:  Larey Seat from ladder yesterday, right hand pain EXAM: RIGHT HAND - COMPLETE 3+ VIEW COMPARISON:  None. FINDINGS: Frontal, oblique, lateral views of the right hand are obtained. 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 No acute fracture, subluxation, or dislocation. There is multifocal osteoarthritis most pronounced at the first and third metacarpophalangeal joints and first interphalangeal joint. Soft tissues are unremarkable. IMPRESSION: 1. Multifocal osteoarthritis. 2. No acute displaced fracture. Electronically Signed   By: Sharlet Salina M.D.   On: 03/11/2021 17:59    Procedures Procedures   Medications Ordered in ED Medications  ketorolac (TORADOL) 15 MG/ML injection 15 mg (15 mg Intravenous Given 03/11/21 1738)    ED Course  I have reviewed the triage vital signs and the nursing notes.  Pertinent labs & imaging results that were available during my care of the patient were reviewed by me and  considered in my medical decision making (see chart for details).  Oree Hislop 50 year old presenting for left upper extremity pain.  Differential diagnosis includes but is not limited to rotator cuff tear, radial nerve injury, brachial plexus injury, wrist drop, wrist fracture, scaphoid fracture, shoulder dislocation, humeral location, elbow fracture.  Due to reassuring physical exam this is less likely radial nerve injury, brachial plexus injury, wrist drop.  I personally reviewed and interpreted the imaging for this patient.  No acute bony abnormalities seen in the right shoulder, elbow, wrist, hand.  Patient does not endorse any snuffbox tenderness.  Scaphoid fracture is less likely.  On physical exam, the patient has 2+ radial pulses and good sensation bilaterally.  No obvious deformities noted.  No overlying skin changes, abrasions, rashes, or ecchymosis noted.  No snuffbox tenderness.  Will give patient Toradol for pain.  Discussed with patient imaging findings.  Recommended heat/ice along with ibuprofen/Tylenol for pain.  Will refer to Ortho.  Return precautions discussed with patient.  Patient agrees with plan.  Patient is stable being discharged  home in good condition.  MDM Rules/Calculators/A&P                          Final Clinical Impression(s) / ED Diagnoses Final diagnoses:  Fall, initial encounter  Acute pain of right shoulder  Right wrist pain  Right hand pain    Rx / DC Orders ED Discharge Orders     None        Achille Rich, Cordelia Poche 03/11/21 2137    Vanetta Mulders, MD 03/18/21 1323

## 2021-03-14 ENCOUNTER — Ambulatory Visit (INDEPENDENT_AMBULATORY_CARE_PROVIDER_SITE_OTHER): Payer: Medicaid Other | Admitting: Family Medicine

## 2021-03-14 ENCOUNTER — Encounter: Payer: Self-pay | Admitting: Family Medicine

## 2021-03-14 ENCOUNTER — Other Ambulatory Visit: Payer: Self-pay

## 2021-03-14 ENCOUNTER — Ambulatory Visit: Payer: Self-pay

## 2021-03-14 VITALS — Ht 70.0 in | Wt 198.0 lb

## 2021-03-14 DIAGNOSIS — M25511 Pain in right shoulder: Secondary | ICD-10-CM

## 2021-03-14 DIAGNOSIS — S43431A Superior glenoid labrum lesion of right shoulder, initial encounter: Secondary | ICD-10-CM | POA: Diagnosis present

## 2021-03-14 DIAGNOSIS — M75120 Complete rotator cuff tear or rupture of unspecified shoulder, not specified as traumatic: Secondary | ICD-10-CM | POA: Insufficient documentation

## 2021-03-14 DIAGNOSIS — S42209A Unspecified fracture of upper end of unspecified humerus, initial encounter for closed fracture: Secondary | ICD-10-CM | POA: Insufficient documentation

## 2021-03-14 MED ORDER — PREDNISONE 5 MG PO TABS: ORAL_TABLET | ORAL | 0 refills | Status: AC

## 2021-03-14 NOTE — Patient Instructions (Signed)
Nice to meet you Please try the sling  Please try heat  Please try the range of motion exercises   Please send me a message in MyChart with any questions or updates.  Please see me back in 2 weeks.   --Dr. Jordan Likes

## 2021-03-14 NOTE — Progress Notes (Signed)
  Nathaniel Solis - 50 y.o. male MRN [id number]  Date of birth: [birth year]  SUBJECTIVE:  Including CC & ROS.  No chief complaint on file.   Nathaniel Solis is a 50 y.o. male that is presenting with acut right shoulder pain.  He had a fall off a ladder and landed on his left shoulder.  Since that time he has had stiffness and pain around the shoulder.  Independent review of the right shoulder x-ray from 9/26 shows no acute changes.   Review of Systems See HPI   HISTORY: Past Medical, Surgical, Social, and Family History Reviewed & Updated per EMR.   Pertinent Historical Findings include:  Past Medical History:  Diagnosis Date   Kidney stone     Past Surgical History:  Procedure Laterality Date   APPENDECTOMY      History reviewed. No pertinent family history.  Social History   Socioeconomic History   Marital status: Married    Spouse name: Not on file   Number of children: Not on file   Years of education: Not on file   Highest education level: Not on file  Occupational History   Not on file  Tobacco Use   Smoking status: Every Day    Packs/day: 0.50    Types: Cigarettes   Smokeless tobacco: Never  Vaping Use   Vaping Use: Never used  Substance and Sexual Activity   Alcohol use: No   Drug use: No   Sexual activity: Not on file  Other Topics Concern   Not on file  Social History Narrative   Not on file   Social Determinants of Health   Financial Resource Strain: Not on file  Food Insecurity: Not on file  Transportation Needs: Not on file  Physical Activity: Not on file  Stress: Not on file  Social Connections: Not on file  Intimate Partner Violence: Not on file     PHYSICAL EXAM:  VS: Ht 5\' 10"  (1.778 m)   Wt 198 lb (89.8 kg)   BMI 28.41 kg/m  Physical Exam Gen: NAD, alert, cooperative with exam, well-appearing   Limited ultrasound: Right shoulder:  Encircling effusion appreciated in the biceps tendon sheath. No changes of the subscapularis. Mild  subacromial bursitis overlying the supraspinatus. No changes in the posterior glenohumeral joint.  Summary: Encircling effusion appreciated around biceps tendon  Ultrasound and interpretation by , MD     ASSESSMENT & PLAN:   Tear of right glenoid labrum He had a fall from a ladder and landed onto his right shoulder.  There is fluid appreciated at the superior portion of the labrum which could indicate a tear.  Possibility of a nondisplaced greater trochanteric fracture as well. -Counseled on home exercise therapy and supportive care. -Prednisone. -Counseled on sling. -Could consider injection or further imaging.

## 2021-03-14 NOTE — Assessment & Plan Note (Signed)
He had a fall from a ladder and landed onto his right shoulder.  There is fluid appreciated at the superior portion of the labrum which could indicate a tear.  Possibility of a nondisplaced greater trochanteric fracture as well. -Counseled on home exercise therapy and supportive care. -Prednisone. -Counseled on sling. -Could consider injection or further imaging.

## 2021-03-28 ENCOUNTER — Encounter: Payer: Self-pay | Admitting: Family Medicine

## 2021-03-28 ENCOUNTER — Ambulatory Visit (INDEPENDENT_AMBULATORY_CARE_PROVIDER_SITE_OTHER): Payer: Medicaid Other | Admitting: Family Medicine

## 2021-03-28 VITALS — Ht 70.0 in | Wt 198.0 lb

## 2021-03-28 DIAGNOSIS — S42294D Other nondisplaced fracture of upper end of right humerus, subsequent encounter for fracture with routine healing: Secondary | ICD-10-CM

## 2021-03-28 NOTE — Assessment & Plan Note (Signed)
Given that he had a fall from height off a ladder onto his right shoulder and now having mechanical symptoms with pain, there is concern of a nondisplaced fracture involving either the proximal humerus or glenoid -Counseled on home exercise therapy and supportive care. -MRI of the right shoulder to evaluate for fracture or labral tear.

## 2021-03-28 NOTE — Progress Notes (Signed)
  Amarius Toto - 50 y.o. male MRN [id number]  Date of birth: Mar 04, 1971  SUBJECTIVE:  Including CC & ROS.  No chief complaint on file.   Heath Tesler is a 50 y.o. male that is presenting with acute worsening of his right shoulder pain.  Still having pain with flexion and abduction.  Pain with any lying on the affected side.   Review of Systems See HPI   HISTORY: Past Medical, Surgical, Social, and Family History Reviewed & Updated per EMR.   Pertinent Historical Findings include:  Past Medical History:  Diagnosis Date   Kidney stone     Past Surgical History:  Procedure Laterality Date   APPENDECTOMY      History reviewed. No pertinent family history.  Social History   Socioeconomic History   Marital status: Married    Spouse name: Not on file   Number of children: Not on file   Years of education: Not on file   Highest education level: Not on file  Occupational History   Not on file  Tobacco Use   Smoking status: Every Day    Packs/day: 0.50    Types: Cigarettes   Smokeless tobacco: Never  Vaping Use   Vaping Use: Never used  Substance and Sexual Activity   Alcohol use: No   Drug use: No   Sexual activity: Not on file  Other Topics Concern   Not on file  Social History Narrative   Not on file   Social Determinants of Health   Financial Resource Strain: Not on file  Food Insecurity: Not on file  Transportation Needs: Not on file  Physical Activity: Not on file  Stress: Not on file  Social Connections: Not on file  Intimate Partner Violence: Not on file     PHYSICAL EXAM:  VS: Ht 5\' 10"  (1.778 m)   Wt 198 lb (89.8 kg)   BMI 28.41 kg/m  Physical Exam Gen: NAD, alert, cooperative with exam, well-appearing MSK: Limited flexion and abduction. Limited external rotation. Positive O'Brien's test. Positive speeds test. Weakness with resistance to exam.    ASSESSMENT & PLAN:   Proximal humeral fracture Given that he had a fall from height off a  ladder onto his right shoulder and now having mechanical symptoms with pain, there is concern of a nondisplaced fracture involving either the proximal humerus or glenoid -Counseled on home exercise therapy and supportive care. -MRI of the right shoulder to evaluate for fracture or labral tear.

## 2021-03-28 NOTE — Patient Instructions (Signed)
Good to see you Please call [phone] to schedule the MRI   Please send me a message in MyChart with any questions or updates.  We will setup a virtual visit once the MRI is resulted.   --Dr. Dylyn Mclaren  

## 2021-03-29 ENCOUNTER — Other Ambulatory Visit: Payer: Self-pay | Admitting: Family Medicine

## 2021-03-29 ENCOUNTER — Encounter: Payer: Self-pay | Admitting: Family Medicine

## 2021-04-01 ENCOUNTER — Other Ambulatory Visit: Payer: Self-pay | Admitting: Family Medicine

## 2021-04-01 ENCOUNTER — Encounter: Payer: Self-pay | Admitting: Family Medicine

## 2021-04-01 MED ORDER — MELOXICAM 7.5 MG PO TABS: 7.5000 mg | ORAL_TABLET | Freq: Two times a day (BID) | ORAL | 1 refills | Status: DC | PRN

## 2021-04-01 NOTE — Progress Notes (Signed)
bic

## 2021-04-04 ENCOUNTER — Ambulatory Visit: Payer: Self-pay

## 2021-04-04 ENCOUNTER — Encounter: Payer: Self-pay | Admitting: Family Medicine

## 2021-04-04 ENCOUNTER — Ambulatory Visit (INDEPENDENT_AMBULATORY_CARE_PROVIDER_SITE_OTHER): Payer: Medicaid Other | Admitting: Family Medicine

## 2021-04-04 VITALS — BP 128/82 | Ht 70.0 in | Wt 198.0 lb

## 2021-04-04 DIAGNOSIS — M25512 Pain in left shoulder: Secondary | ICD-10-CM

## 2021-04-04 DIAGNOSIS — M67912 Unspecified disorder of synovium and tendon, left shoulder: Secondary | ICD-10-CM | POA: Diagnosis not present

## 2021-04-04 MED ORDER — NITROGLYCERIN 0.2 MG/HR TD PT24: MEDICATED_PATCH | TRANSDERMAL | 11 refills | Status: DC

## 2021-04-04 NOTE — Assessment & Plan Note (Signed)
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 - counseled on home exercise therapy and supportive care - nitro patches  - can consider hydrodilatation going forward.

## 2021-04-04 NOTE — Patient Instructions (Signed)
Good to see you Please continue the exercises  Please try heat before exercise and ice after  Please try the nitro patches   Please send me a message in MyChart with any questions or updates.  Please see me back in 4 weeks.   --Dr. Jordan Likes  Nitroglycerin Protocol  Apply 1/4 nitroglycerin patch to affected area daily. Change position of patch within the affected area every 24 hours. You may experience a headache during the first 1-2 weeks of using the patch, these should subside. If you experience headaches after beginning nitroglycerin patch treatment, you may take your preferred over the counter pain reliever. Another side effect of the nitroglycerin patch is skin irritation or rash related to patch adhesive. Please notify our office if you develop more severe headaches or rash, and stop the patch. Tendon healing with nitroglycerin patch may require 12 to 24 weeks depending on the extent of injury. Men should not use if taking Viagra, Cialis, or Levitra.  Do not use if you have migraines or rosacea.

## 2021-04-04 NOTE — Progress Notes (Signed)
  Nathaniel Solis - 50 y.o. male MRN [id number]  Date of birth: 1971/01/23  SUBJECTIVE:  Including CC & ROS.  No chief complaint on file.   Nathaniel Solis is a 50 y.o. male that is presenting with acute on chronic left shoulder pain.  He has a history of surgery a little over a year ago.  Since that time he has had ongoing pain and limited range of motion..    Review of Systems See HPI   HISTORY: Past Medical, Surgical, Social, and Family History Reviewed & Updated per EMR.   Pertinent Historical Findings include:  Past Medical History:  Diagnosis Date   Kidney stone     Past Surgical History:  Procedure Laterality Date   APPENDECTOMY      History reviewed. No pertinent family history.  Social History   Socioeconomic History   Marital status: Married    Spouse name: Not on file   Number of children: Not on file   Years of education: Not on file   Highest education level: Not on file  Occupational History   Not on file  Tobacco Use   Smoking status: Every Day    Packs/day: 0.50    Types: Cigarettes   Smokeless tobacco: Never  Vaping Use   Vaping Use: Never used  Substance and Sexual Activity   Alcohol use: No   Drug use: No   Sexual activity: Not on file  Other Topics Concern   Not on file  Social History Narrative   Not on file   Social Determinants of Health   Financial Resource Strain: Not on file  Food Insecurity: Not on file  Transportation Needs: Not on file  Physical Activity: Not on file  Stress: Not on file  Social Connections: Not on file  Intimate Partner Violence: Not on file     PHYSICAL EXAM:  VS: BP 128/82 (BP Location: Left Arm, Patient Position: Sitting, Cuff Size: Large)   Ht 5\' 10"  (1.778 m)   Wt 198 lb (89.8 kg)   BMI 28.41 kg/m  Physical Exam Gen: NAD, alert, cooperative with exam, well-appearing   Limited ultrasound: Left shoulder:  Biceps tendon has been relocated outside of the bicipital groove. Overlying synovial changes  of the subscapularis. Surgical changes appreciated supraspinatus with degenerative changes of the tendon  Summary: Postsurgical changes of the rotator cuff  Ultrasound and interpretation by , MD    ASSESSMENT & PLAN:   Tendinopathy of rotator cuff, left Has degenerative changes of the rotator cuff with limited external rotation. - counseled on home exercise therapy and supportive care - nitro patches  - can consider hydrodilatation going forward.

## 2021-04-07 ENCOUNTER — Other Ambulatory Visit: Payer: Medicaid Other

## 2021-04-18 ENCOUNTER — Other Ambulatory Visit: Payer: Self-pay

## 2021-04-18 ENCOUNTER — Ambulatory Visit
Admission: RE | Admit: 2021-04-18 | Discharge: 2021-04-18 | Disposition: A | Discharge status: Home / Self Care | Payer: Medicaid Other | Source: Ambulatory Visit | Attending: Family Medicine | Admitting: Family Medicine

## 2021-04-18 DIAGNOSIS — S42294D Other nondisplaced fracture of upper end of right humerus, subsequent encounter for fracture with routine healing: Secondary | ICD-10-CM

## 2021-04-22 ENCOUNTER — Encounter: Payer: Self-pay | Admitting: Family Medicine

## 2021-04-22 ENCOUNTER — Telehealth (INDEPENDENT_AMBULATORY_CARE_PROVIDER_SITE_OTHER): Payer: Medicaid Other | Admitting: Family Medicine

## 2021-04-22 VITALS — Ht 70.0 in | Wt 198.0 lb

## 2021-04-22 DIAGNOSIS — S46011D Strain of muscle(s) and tendon(s) of the rotator cuff of right shoulder, subsequent encounter: Secondary | ICD-10-CM

## 2021-04-22 NOTE — Progress Notes (Signed)
Virtual Visit via Video Note  I connected with Adriana Mccallum on 04/22/21 at  2:50 PM EST by a video enabled telemedicine application and verified that I am speaking with the correct person using two identifiers.  Location: Patient: vehicle  Provider: office   I discussed the limitations of evaluation and management by telemedicine and the availability of in person appointments. The patient expressed understanding and agreed to proceed.  History of Present Illness:  Mr. Tinnell is a 50 year old male that is following up after the MRI of his right shoulder.  He fell off a ladder and presented with ongoing pain.  MRI was demonstrating a full-thickness supraspinatus tear.  Also encircling effusion appreciated the biceps tendon.   Observations/Objective:   Assessment and Plan:  Full-thickness right supraspinatus rotator cuff tear. Initial injury was on 9/26.  He had a fall off a ladder at that time.  Had ongoing pain and MRI was showing a full-thickness tear. -Counseled on home exercise therapy and supportive care. -Referral to orthopedic surgery. -Counseled on meloxicam.  Follow Up Instructions:    I discussed the assessment and treatment plan with the patient. The patient was provided an opportunity to ask questions and all were answered. The patient agreed with the plan and demonstrated an understanding of the instructions.   The patient was advised to call back or seek an in-person evaluation if the symptoms worsen or if the condition fails to improve as anticipated.    Clare Gandy, MD

## 2021-04-22 NOTE — Assessment & Plan Note (Signed)
Initial injury was on 9/26.  He had a fall off a ladder at that time.  Had ongoing pain and MRI was showing a full-thickness tear. -Counseled on home exercise therapy and supportive care. -Referral to orthopedic surgery. -Counseled on meloxicam.

## 2021-05-08 ENCOUNTER — Ambulatory Visit (INDEPENDENT_AMBULATORY_CARE_PROVIDER_SITE_OTHER): Payer: Medicaid Other | Admitting: Family Medicine

## 2021-05-08 ENCOUNTER — Encounter: Payer: Self-pay | Admitting: Family Medicine

## 2021-05-08 ENCOUNTER — Other Ambulatory Visit: Payer: Self-pay | Admitting: Family Medicine

## 2021-05-08 DIAGNOSIS — M67912 Unspecified disorder of synovium and tendon, left shoulder: Secondary | ICD-10-CM

## 2021-05-08 NOTE — Progress Notes (Signed)
  Nathaniel Solis - 50 y.o. male MRN [id number]  Date of birth: May 11, 1971  SUBJECTIVE:  Including CC & ROS.  No chief complaint on file.   Nathaniel Solis is a 50 y.o. male that is following up for his left shoulder pain.  The nitroglycerin has improved his pain.  His function has improved as well.   Review of Systems See HPI   HISTORY: Past Medical, Surgical, Social, and Family History Reviewed & Updated per EMR.   Pertinent Historical Findings include:  Past Medical History:  Diagnosis Date   Kidney stone     Past Surgical History:  Procedure Laterality Date   APPENDECTOMY      History reviewed. No pertinent family history.  Social History   Socioeconomic History   Marital status: Married    Spouse name: Not on file   Number of children: Not on file   Years of education: Not on file   Highest education level: Not on file  Occupational History   Not on file  Tobacco Use   Smoking status: Every Day    Packs/day: 0.50    Types: Cigarettes   Smokeless tobacco: Never  Vaping Use   Vaping Use: Never used  Substance and Sexual Activity   Alcohol use: No   Drug use: No   Sexual activity: Not on file  Other Topics Concern   Not on file  Social History Narrative   Not on file   Social Determinants of Health   Financial Resource Strain: Not on file  Food Insecurity: Not on file  Transportation Needs: Not on file  Physical Activity: Not on file  Stress: Not on file  Social Connections: Not on file  Intimate Partner Violence: Not on file     PHYSICAL EXAM:  VS: BP 118/72 (BP Location: Left Arm, Patient Position: Sitting)   Ht 5\' 10"  (1.778 m)   Wt 198 lb (89.8 kg)   BMI 28.41 kg/m  Physical Exam Gen: NAD, alert, cooperative with exam, well-appearing   ECSWT Note Nathaniel Solis 09-Apr-1971  Procedure: ECSWT Indications: left shoulder pain   Procedure Details Consent: Risks of procedure as well as the alternatives and risks of each were explained to the  (patient/caregiver).  Consent for procedure obtained. Time Out: Verified patient identification, verified procedure, site/side was marked, verified correct patient position, special equipment/implants available, medications/allergies/relevent history reviewed, required imaging and test results available.  Performed.  The area was cleaned with iodine and alcohol swabs.    The left shoulder was targeted for Extracorporeal shockwave therapy.   Preset: shoulder problems Power Level: 80 Frequency: 10 Impulse/cycles: 2600 Head size: medium   Session: first  Patient did tolerate procedure well.     ASSESSMENT & PLAN:   Tendinopathy of rotator cuff, left Has gotten improvement in his pain and function with the nitroglycerin. -Counseled on home exercise therapy and supportive care. -Shockwave today. -Continue nitroglycerin. -Could repeat shockwave or do physical therapy.

## 2021-05-08 NOTE — Assessment & Plan Note (Signed)
Has gotten improvement in his pain and function with the nitroglycerin. -Counseled on home exercise therapy and supportive care. -Shockwave today. -Continue nitroglycerin. -Could repeat shockwave or do physical therapy.

## 2021-06-13 ENCOUNTER — Other Ambulatory Visit: Payer: Self-pay

## 2021-06-13 ENCOUNTER — Ambulatory Visit: Payer: Medicaid Other | Attending: Orthopedic Surgery | Admitting: Physical Therapy

## 2021-06-13 ENCOUNTER — Encounter: Payer: Self-pay | Admitting: Physical Therapy

## 2021-06-13 DIAGNOSIS — M6281 Muscle weakness (generalized): Secondary | ICD-10-CM | POA: Insufficient documentation

## 2021-06-13 DIAGNOSIS — M25611 Stiffness of right shoulder, not elsewhere classified: Secondary | ICD-10-CM | POA: Diagnosis present

## 2021-06-13 DIAGNOSIS — M62838 Other muscle spasm: Secondary | ICD-10-CM | POA: Diagnosis present

## 2021-06-13 DIAGNOSIS — M25511 Pain in right shoulder: Secondary | ICD-10-CM | POA: Insufficient documentation

## 2021-06-13 NOTE — Therapy (Signed)
[facility] [address]  [address] [place], [place], [address] Phone: [phone]   Fax:  [phone]  Physical Therapy Evaluation  Patient Details  Name: Nathaniel Solis MRN: [id number] Date of Birth: Jun 02, 1971 Referring Provider (PT): Jones Broom MD   Encounter Date: 06/13/2021   PT End of Session - 06/13/21 1556     Visit Number 1    Number of Visits 16    Date for PT Re-Evaluation 08/08/21    Authorization Type [employer] MCD    PT Start Time 1400    PT Stop Time 1445    PT Time Calculation (min) 45 min    Activity Tolerance Patient tolerated treatment well    Behavior During Therapy [facility] for tasks assessed/performed             Past Medical History:  Diagnosis Date   Kidney stone     Past Surgical History:  Procedure Laterality Date   APPENDECTOMY      There were no vitals filed for this visit.    Subjective Assessment - 06/13/21 1439     Subjective Pt. reports having R RTC repair with SAD on 05/16/21.    Currently in Pain? Yes    Pain Score 3    7-8/10 in AM   Pain Location Shoulder    Pain Orientation Right    Pain Descriptors / Indicators Aching;Spasm    Pain Type Surgical pain    Pain Radiating Towards R biceps    Pain Onset 1 to 4 weeks ago   05/16/21 surgery date   Pain Frequency Constant    Aggravating Factors  sleeping, just the weight of the shoulder at the end of the day    Pain Relieving Factors ice machine    Effect of Pain on Daily Activities can't use arm, can't work.                [facility] PT Assessment - 06/13/21 0001       Assessment   Medical Diagnosis Z98.890 (ICD-10-CM) - S/P rotator cuff repair    Referring Provider (PT) Jones Broom MD    Onset Date/Surgical Date 05/16/21    Hand Dominance Right    Next MD Visit 06/24/2021   Dr. Jordan Likes   Prior Therapy yes, for L shoulder RTR in 2021      Precautions   Precautions Shoulder    Type of Shoulder Precautions  see protocol for RTC repair      Balance Screen   Has the patient fallen in the past 6 months Yes    How many times? 1    Has the patient had a decrease in activity level because of a fear of falling?  No    Is the patient reluctant to leave their home because of a fear of falling?  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  Not wearing sling today.    Focus on Therapeutic Outcomes (FOTO)  shoulder 24% - 64% predicted after 21 visits      ROM / Strength   AROM / PROM / Strength AROM;PROM;Strength      AROM   Overall AROM  Deficits    Overall AROM Comments tested in sitting, R shoulder deferred to post surgical protocol.    AROM Assessment Site Shoulder    Right/Left Shoulder Right;Left    Left Shoulder Extension 65 Degrees    Left Shoulder Flexion 130 Degrees    Left Shoulder ABduction 125 Degrees    Left Shoulder Internal Rotation --   functional IR to T10   Left Shoulder External Rotation --   functional ER to T4     PROM   Overall PROM  Deficits    PROM Assessment Site Shoulder    Right/Left Shoulder Right    Right Shoulder Flexion 85 Degrees    Right Shoulder ABduction 70 Degrees    Right Shoulder Internal Rotation 60 Degrees   at 45 deg shoulder abduction   Right Shoulder External Rotation 55 Degrees   at 45 deg shoulder abduction     Strength   Overall Strength Deficits     Overall Strength Comments strength testing deferred due to post surgical precautions                        Objective measurements completed on examination: See above findings.                PT Education - 06/13/21 1556     Education Details reviewed exercises and precautions, issued yellow theraputty for grip strengthening.    Person(s) Educated Patient    Methods Explanation;Demonstration    Comprehension Verbalized understanding;Returned demonstration              PT Short Term Goals - 06/13/21 1603       PT SHORT TERM GOAL #1   Title Ind with initial HEP    Time 2    Period Weeks    Status New    Target Date 06/27/21               PT Long Term Goals - 06/13/21 1755       PT LONG TERM GOAL #1   Title Pt. will be independent with progressed HEP for RUE strengthening to improve outcomes.    Time 8    Period Weeks    Status New    Target Date 08/08/21      PT LONG TERM GOAL #2   Title Pt. will demonstrate full R shoulder ROM without pain in order to perform overhead job activities.    Baseline see flowsheet    Time 8    Period Weeks    Status New    Target Date 08/08/21      PT LONG TERM GOAL #3   Title Pt. will demonstrate 5/5 R shoulder strength without pain in order to start lifting weights.    Time 8    Period Weeks    Status New    Target Date 08/08/21      PT LONG TERM GOAL #4   Title Pt. will be able to lift 40lbs without increased R shoulder pain to return to work.    Time 8    Period  Weeks    Status New    Target Date 08/08/21                    Plan - 06/13/21 1557     Clinical Impression Statement Nathaniel Solis is a 50 year old s/p Right shoulder rotator cuff repair with subacromial decompression on 05/16/2021.  He reports having L RTC repair surgery over a year ago, so familiar with exercises and precautions, has been performing grip strengthening, pendulums and table slides, and reports good pain  control on right.  He wears his sling out in public and at night to protect shoulder.  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    Personal Factors and Comorbidities Comorbidity 3+    Comorbidities history L RTC repair, COPD, smoker    Examination-Activity Limitations Carry;Bend;Lift;Reach Overhead;Caring for Others;Sleep    Examination-Participation Restrictions Occupation;Interpersonal Relationship;Cleaning;Community Activity;Driving;Meal Prep    Stability/Clinical Decision Making Stable/Uncomplicated    Clinical Decision Making Low    Rehab Potential Good    PT Frequency 2x / week    PT Duration 8 weeks    PT Treatment/Interventions ADLs/Self Care Home Management;Cryotherapy;Electrical Stimulation;Iontophoresis 4mg /ml Dexamethasone;Moist Heat;Ultrasound;Therapeutic activities;Therapeutic exercise;Neuromuscular re-education;Patient/family education;Manual techniques;Scar mobilization;Passive range of motion;Dry needling;Taping;Joint Manipulations    PT Next Visit Plan progress ROM and exercises per protocol.  Post op week 4 as of 06/13/21.    Consulted and Agree with Plan of Care Patient             Patient will benefit from skilled therapeutic intervention in order to improve the following deficits and impairments:  Decreased skin integrity, Increased muscle spasms, Decreased range of motion, Decreased scar mobility, Decreased activity tolerance, Decreased strength, Increased fascial restricitons, Impaired flexibility, Impaired UE functional use, Postural dysfunction, Pain  Visit Diagnosis: Acute pain of right shoulder  Stiffness of right shoulder, not elsewhere classified  Other muscle spasm  Muscle weakness (generalized)     Problem List Patient Active Problem List   Diagnosis Date Noted   Tendinopathy of rotator cuff, left  04/04/2021   Full thickness rotator cuff tear 03/14/2021    03/16/2021, PT, DPT  06/13/2021, 6:01 PM  [facility] [address]  [address] [place], [place], [place] Phone: [phone]   Fax:  [phone]  Name: Nathaniel Solis MRN: Adriana Mccallum Date of Birth: 10-02-1970

## 2021-06-20 ENCOUNTER — Other Ambulatory Visit: Payer: Self-pay | Admitting: Family Medicine

## 2021-06-20 ENCOUNTER — Ambulatory Visit: Payer: Medicaid Other | Attending: Orthopedic Surgery

## 2021-06-20 ENCOUNTER — Other Ambulatory Visit: Payer: Self-pay

## 2021-06-20 DIAGNOSIS — M25511 Pain in right shoulder: Secondary | ICD-10-CM | POA: Diagnosis not present

## 2021-06-20 DIAGNOSIS — M6281 Muscle weakness (generalized): Secondary | ICD-10-CM | POA: Diagnosis present

## 2021-06-20 DIAGNOSIS — M62838 Other muscle spasm: Secondary | ICD-10-CM | POA: Diagnosis present

## 2021-06-20 DIAGNOSIS — M25611 Stiffness of right shoulder, not elsewhere classified: Secondary | ICD-10-CM | POA: Diagnosis present

## 2021-06-20 NOTE — Therapy (Signed)
[facility] [address]  [address] [place], [place], [address] Phone: [phone]   Fax:  [phone]  Physical Therapy Treatment  Patient Details  Name: Falon Harbottle MRN: [id number] Date of Birth: Oct 14, 1970 Referring Provider (PT): Jones Broom MD   Encounter Date: 06/20/2021   PT End of Session - 06/20/21 1547     Visit Number 2    Number of Visits 16    Date for PT Re-Evaluation 08/08/21    Authorization Type [employer] MCD    PT Start Time 1450    PT Stop Time 1534    PT Time Calculation (min) 44 min    Activity Tolerance Patient tolerated treatment well    Behavior During Therapy [facility] for tasks assessed/performed             Past Medical History:  Diagnosis Date   Kidney stone     Past Surgical History:  Procedure Laterality Date   APPENDECTOMY      There were no vitals filed for this visit.   Subjective Assessment - 06/20/21 1455     Subjective Pt reports that his shoulder has been hurting more the past couple of days. He has been wearing his sling at all times but not when coming to therapy.    Currently in Pain? Yes    Pain Score 4     Pain Location Shoulder    Pain Orientation Right    Pain Descriptors / Indicators Aching;Spasm    Pain Type Surgical pain                               [facility] Adult PT Treatment/Exercise - 06/20/21 0001       Exercises   Exercises Neck;Elbow;Wrist      Elbow Exercises   Elbow Flexion AROM;Right;15 reps;Seated      Neck Exercises: Seated   Neck Retraction 10 reps;3 secs    Cervical Rotation Both;10 reps    Other Seated Exercise shoulder squeezes 20 reps      Neck Exercises: Stretches   Upper Trapezius Stretch Right;30 seconds    Levator Stretch Right;30 seconds      Manual Therapy   Manual Therapy Soft tissue mobilization;Myofascial release    Soft tissue mobilization STM to R UT, levator, lower traps, and rhomboids     Myofascial Release TPR to R UT, levator                     PT Education - 06/20/21 1545     Education Details Education given on precautions and rationale for upper neck tightness.    Person(s) Educated Patient    Methods Explanation;Demonstration    Comprehension Verbalized understanding              PT Short Term Goals - 06/20/21 1553       PT SHORT TERM GOAL #1   Title Ind with initial HEP    Time 2    Period Weeks    Status On-going    Target Date 06/27/21               PT Long Term Goals - 06/20/21 1553       PT LONG TERM GOAL #1   Title Pt. will be independent with progressed HEP for RUE strengthening to improve outcomes.    Time 8    Period Weeks    Status On-going  Target Date 08/08/21      PT LONG TERM GOAL #2   Title Pt. will demonstrate full R shoulder ROM without pain in order to perform overhead job activities.    Baseline see flowsheet    Time 8    Period Weeks    Status On-going    Target Date 08/08/21      PT LONG TERM GOAL #3   Title Pt. will demonstrate 5/5 R shoulder strength without pain in order to start lifting weights.    Time 8    Period Weeks    Status On-going    Target Date 08/08/21      PT LONG TERM GOAL #4   Title Pt. will be able to lift 40lbs without increased R shoulder pain to return to work.    Time 8    Period Weeks    Status On-going    Target Date 08/08/21                   Plan - 06/20/21 1548     Clinical Impression Statement Pt reported increased pain over the past few days in his R shoulder and into the neck. Due to the fact that he is not currently doing active motion of his R shoulder, I explained to him that his neck and shoulder muscles are really stiff from lack of movement. He did have improvements in painfree cervical ROM after the STM. Reinforced his shoulder precautions and the importance of using ice to control swelling. Overall he responded well to the treatment.     Personal Factors and Comorbidities Comorbidity 3+    Comorbidities history L RTC repair, COPD, smoker    PT Frequency 2x / week    PT Duration 8 weeks    PT Treatment/Interventions ADLs/Self Care Home Management;Cryotherapy;Electrical Stimulation;Iontophoresis 4mg /ml Dexamethasone;Moist Heat;Ultrasound;Therapeutic activities;Therapeutic exercise;Neuromuscular re-education;Patient/family education;Manual techniques;Scar mobilization;Passive range of motion;Dry needling;Taping;Joint Manipulations    PT Next Visit Plan progress ROM and exercises per protocol.  Post op week 5 as of 06/20/21.    Consulted and Agree with Plan of Care Patient             Patient will benefit from skilled therapeutic intervention in order to improve the following deficits and impairments:  Decreased skin integrity, Increased muscle spasms, Decreased range of motion, Decreased scar mobility, Decreased activity tolerance, Decreased strength, Increased fascial restricitons, Impaired flexibility, Impaired UE functional use, Postural dysfunction, Pain  Visit Diagnosis: Acute pain of right shoulder  Stiffness of right shoulder, not elsewhere classified  Other muscle spasm  Muscle weakness (generalized)     Problem List Patient Active Problem List   Diagnosis Date Noted   Tendinopathy of rotator cuff, left 04/04/2021   Full thickness rotator cuff tear 03/14/2021    Artist Pais, PTA 06/20/2021, 3:59 PM  [facility] [address]  [place], [place], [address] Phone: [phone]   Fax:  [phone]  Name: Verbon Alsbrooks MRN: [id number] Date of Birth: [birth year]

## 2021-06-24 ENCOUNTER — Other Ambulatory Visit: Payer: Self-pay

## 2021-06-24 ENCOUNTER — Encounter: Payer: Self-pay | Admitting: Physical Therapy

## 2021-06-24 ENCOUNTER — Ambulatory Visit: Payer: Medicaid Other | Admitting: Physical Therapy

## 2021-06-24 DIAGNOSIS — M25511 Pain in right shoulder: Secondary | ICD-10-CM

## 2021-06-24 DIAGNOSIS — M25611 Stiffness of right shoulder, not elsewhere classified: Secondary | ICD-10-CM

## 2021-06-24 DIAGNOSIS — M62838 Other muscle spasm: Secondary | ICD-10-CM

## 2021-06-24 DIAGNOSIS — M6281 Muscle weakness (generalized): Secondary | ICD-10-CM

## 2021-06-24 NOTE — Patient Instructions (Signed)
Access Code: [email or web address] URL: [email or web address] Date: 06/24/2021 Prepared by: Harrie Foreman  Exercises Supine Shoulder Flexion Overhead with Dowel - 1 x daily - 7 x weekly - 2 sets - 10 reps Supine Shoulder Flexion AAROM with Hands Clasped - 1 x daily - 7 x weekly - 2 sets - 10 reps   Trigger Point Dry Needling  What is Trigger Point Dry Needling (DN)? DN is a physical therapy technique used to treat muscle pain and dysfunction. Specifically, DN helps deactivate muscle trigger points (muscle knots).  A thin filiform needle is used to penetrate the skin and stimulate the underlying trigger point. The goal is for a local twitch response (LTR) to occur and for the trigger point to relax. No medication of any kind is injected during the procedure.   What Does Trigger Point Dry Needling Feel Like?  The procedure feels different for each individual patient. Some patients report that they do not actually feel the needle enter the skin and overall the process is not painful. Very mild bleeding may occur. However, many patients feel a deep cramping in the muscle in which the needle was inserted. This is the local twitch response.   How Will I feel after the treatment? Soreness is normal, and the onset of soreness may not occur for a few hours. Typically this soreness does not last longer than two days.  Bruising is uncommon, however; ice can be used to decrease any possible bruising.  In rare cases feeling tired or nauseous after the treatment is normal. In addition, your symptoms may get worse before they get better, this period will typically not last longer than 24 hours.   What Can I do After My Treatment? Increase your hydration by drinking more water for the next 24 hours. You may place ice or heat on the areas treated that have become sore, however, do not use heat on inflamed or bruised areas. Heat often brings more relief post needling. You can continue your regular  activities, but vigorous activity is not recommended initially after the treatment for 24 hours. DN is best combined with other physical therapy such as strengthening, stretching, and other therapies.

## 2021-06-24 NOTE — Therapy (Signed)
[facility] Outpatient Rehabilitation [facility] [address]  [address] [place], [place], [address] Phone: [phone]   Fax:  [phone]  Physical Therapy Treatment  Patient Details  Name: Nathaniel Solis MRN: [id number] Date of Birth: 1971/04/03 Referring Provider (PT): Jones Broom MD   Encounter Date: 06/24/2021   PT End of Session - 06/24/21 1556     Visit Number 3    Number of Visits 16    Date for PT Re-Evaluation 08/08/21    Authorization Type [employer] MCD    PT Start Time 1448    PT Stop Time 1530    PT Time Calculation (min) 42 min    Activity Tolerance Patient tolerated treatment well    Behavior During Therapy [facility] for tasks assessed/performed             Past Medical History:  Diagnosis Date   Kidney stone     Past Surgical History:  Procedure Laterality Date   APPENDECTOMY      There were no vitals filed for this visit.                      [facility] Adult PT Treatment/Exercise - 06/24/21 0001       Exercises   Exercises Shoulder      Shoulder Exercises: Supine   Flexion AAROM;Right;20 reps    Shoulder Flexion Weight (lbs) to tolerance, trialed with cane and with clasped hands      Manual Therapy   Manual Therapy Soft tissue mobilization;Passive ROM;Other (comment)    Soft tissue mobilization STM to R UT, IASTM with s/s tools to R biceps and brachioradialis    Myofascial Release TPR to R brachioradialis and R UT    Passive ROM R shoulder all directions to tolerance.    Other Manual Therapy skilled palpation and monitoring during dry needling.              Trigger Point Dry Needling - 06/24/21 0001     Consent Given? Yes    Education Handout Provided Yes    Muscles Treated Upper Quadrant Brachioradialis    Dry Needling Comments right    Brachioradialis Response Twitch response elicited;Palpable increased muscle length                   PT Education - 06/24/21 1820     Education  Details Access Code: [email or web address]  Education on initial HEP for AAROM supine flexion, also education on dry needling.    Person(s) Educated Patient    Methods Explanation;Demonstration;Verbal cues;Handout    Comprehension Verbalized understanding;Returned demonstration              PT Short Term Goals - 06/20/21 1553       PT SHORT TERM GOAL #1   Title Ind with initial HEP    Time 2    Period Weeks    Status On-going    Target Date 06/27/21               PT Long Term Goals - 06/20/21 1553       PT LONG TERM GOAL #1   Title Pt. will be independent with progressed HEP for RUE strengthening to improve outcomes.    Time 8    Period Weeks    Status On-going    Target Date 08/08/21      PT LONG TERM GOAL #2   Title Pt. will demonstrate full R shoulder ROM without pain in order to  perform overhead job activities.    Baseline see flowsheet    Time 8    Period Weeks    Status On-going    Target Date 08/08/21      PT LONG TERM GOAL #3   Title Pt. will demonstrate 5/5 R shoulder strength without pain in order to start lifting weights.    Time 8    Period Weeks    Status On-going    Target Date 08/08/21      PT LONG TERM GOAL #4   Title Pt. will be able to lift 40lbs without increased R shoulder pain to return to work.    Time 8    Period Weeks    Status On-going    Target Date 08/08/21                   Plan - 06/24/21 1816     Clinical Impression Statement Patient reportes significant improvement overall to neck pain, but still has some tightness in R UT also complaining of pain in biceps and brachioradialis.  Week 5 of post-surgical protocol, so initiated gentle PROM to tolerance, about 100 deg of flexion tolerated.  Also initiated AAROM for flexion in supine, trying difference positions (with cane pushing up, overhead both arms with cane aggravated L shoulder pain, clasping hands tolerated better).  Continued manual therapy to UT, noted very large trigger  point in R brachioradialis which improved with dry needling.  Pt. would benefit from continued skilled therapy.    Personal Factors and Comorbidities Comorbidity 3+    Comorbidities history L RTC repair, COPD, smoker    PT Frequency 2x / week    PT Duration 8 weeks    PT Treatment/Interventions ADLs/Self Care Home Management;Cryotherapy;Electrical Stimulation;Iontophoresis 4mg /ml Dexamethasone;Moist Heat;Ultrasound;Therapeutic activities;Therapeutic exercise;Neuromuscular re-education;Patient/family education;Manual techniques;Scar mobilization;Passive range of motion;Dry needling;Taping;Joint Manipulations    PT Next Visit Plan progress ROM and exercises per protocol.  Post op week 6 as of 06/27/21.    PT Home Exercise Plan Access Code: [email or web address]    Consulted and Agree with Plan of Care Patient             Patient will benefit from skilled therapeutic intervention in order to improve the following deficits and impairments:  Decreased skin integrity, Increased muscle spasms, Decreased range of motion, Decreased scar mobility, Decreased activity tolerance, Decreased strength, Increased fascial restricitons, Impaired flexibility, Impaired UE functional use, Postural dysfunction, Pain  Visit Diagnosis: Acute pain of right shoulder  Stiffness of right shoulder, not elsewhere classified  Other muscle spasm  Muscle weakness (generalized)     Problem List Patient Active Problem List   Diagnosis Date Noted   Tendinopathy of rotator cuff, left 04/04/2021   Full thickness rotator cuff tear 03/14/2021    03/16/2021, PT, DPT  06/24/2021, 6:22 PM  [facility] Outpatient Rehabilitation [facility] [address]  [address] [place], [place], [place] Phone: [phone]   Fax:  [phone]  Name: Nathaniel Solis MRN: Adriana Mccallum Date of Birth: 08/30/1970

## 2021-06-27 ENCOUNTER — Ambulatory Visit: Payer: Medicaid Other

## 2021-06-27 ENCOUNTER — Other Ambulatory Visit: Payer: Self-pay

## 2021-06-27 DIAGNOSIS — M25511 Pain in right shoulder: Secondary | ICD-10-CM | POA: Diagnosis not present

## 2021-06-27 DIAGNOSIS — M25611 Stiffness of right shoulder, not elsewhere classified: Secondary | ICD-10-CM

## 2021-06-27 DIAGNOSIS — M6281 Muscle weakness (generalized): Secondary | ICD-10-CM

## 2021-06-27 DIAGNOSIS — M62838 Other muscle spasm: Secondary | ICD-10-CM

## 2021-06-27 NOTE — Therapy (Signed)
[facility] Outpatient Rehabilitation [facility] [address]  [address] [place], [place], [address] Phone: [phone]   Fax:  [phone]  Physical Therapy Treatment  Patient Details  Name: Nathaniel Solis MRN: [id number] Date of Birth: 09-18-1970 Referring Provider (PT): Jones Broom MD   Encounter Date: 06/27/2021   PT End of Session - 06/27/21 1359     Visit Number 4    Number of Visits 16    Date for PT Re-Evaluation 08/08/21    Authorization Type [employer] M[employer]    PT Start Time 1320   pt late   PT Stop Time 1356    PT Time Calculation (min) 36 min    Activity Tolerance Patient tolerated treatment well    Behavior During Therapy [facility] for tasks assessed/performed             Past Medical History:  Diagnosis Date   Kidney stone     Past Surgical History:  Procedure Laterality Date   APPENDECTOMY      There were no vitals filed for this visit.   Subjective Assessment - 06/27/21 1322     Subjective Pt reports that the DN and STM has really helped his shoulder.    Currently in Pain? Yes    Pain Score 3     Pain Location Shoulder    Pain Orientation Right    Pain Descriptors / Indicators Aching    Pain Type Surgical pain                               [facility] Adult PT Treatment/Exercise - 06/27/21 0001       Shoulder Exercises: Supine   Protraction AAROM;Both;15 reps    Protraction Limitations with cane, tactile cues needed to avoid shoulder extension    Flexion AAROM;20 reps    Flexion Limitations with cane, to tolerance    ABduction AAROM;15 reps    ABduction Limitations with cane      Shoulder Exercises: Standing   Retraction Strengthening;Both;10 reps    Retraction Limitations shoulder suqeezes with pool noodle, 10x5"    Other Standing Exercises shoulder depression 10x5"      Shoulder Exercises: Therapy Ball   Flexion Both;10 reps    Flexion Limitations AAROM with green Pball    Scaption Both;10 reps     Scaption Limitations AAROM with green Pball      Shoulder Exercises: Isometric Strengthening   Extension 5X10"    Extension Limitations with towel into wall    External Rotation 5X10"    External Rotation Limitations with towel into wall    Internal Rotation 5X10"    Internal Rotation Limitations with towel into wall                       PT Short Term Goals - 06/27/21 1400       PT SHORT TERM GOAL #1   Title Ind with initial HEP    Time 2    Period Weeks    Status Achieved   06/27/21   Target Date 06/27/21               PT Long Term Goals - 06/20/21 1553       PT LONG TERM GOAL #1   Title Pt. will be independent with progressed HEP for RUE strengthening to improve outcomes.    Time 8    Period Weeks  Status On-going    Target Date 08/08/21      PT LONG TERM GOAL #2   Title Pt. will demonstrate full R shoulder ROM without pain in order to perform overhead job activities.    Baseline see flowsheet    Time 8    Period Weeks    Status On-going    Target Date 08/08/21      PT LONG TERM GOAL #3   Title Pt. will demonstrate 5/5 R shoulder strength without pain in order to start lifting weights.    Time 8    Period Weeks    Status On-going    Target Date 08/08/21      PT LONG TERM GOAL #4   Title Pt. will be able to lift 40lbs without increased R shoulder pain to return to work.    Time 8    Period Weeks    Status On-going    Target Date 08/08/21                   Plan - 06/27/21 1359     Clinical Impression Statement Pt tolerated the progression of exercises well. With the supine cane exercises he showed a protracted scapula that caused some pinching in his shoulder, with tactile cues it improved and then we worked on the scapular stabilizers. No reports of pain with any of the exercises, reinforced precuations, and using ice as needed to control swelling.    Personal Factors and Comorbidities Comorbidity 3+    Comorbidities history  L RTC repair, COPD, smoker    PT Frequency 2x / week    PT Duration 8 weeks    PT Treatment/Interventions ADLs/Self Care Home Management;Cryotherapy;Electrical Stimulation;Iontophoresis 4mg /ml Dexamethasone;Moist Heat;Ultrasound;Therapeutic activities;Therapeutic exercise;Neuromuscular re-education;Patient/family education;Manual techniques;Scar mobilization;Passive range of motion;Dry needling;Taping;Joint Manipulations    PT Next Visit Plan progress ROM and exercises per protocol.  Post op week 6 as of 06/27/21.    PT Home Exercise Plan Access Code: [email or web address]    Consulted and Agree with Plan of Care Patient             Patient will benefit from skilled therapeutic intervention in order to improve the following deficits and impairments:  Decreased skin integrity, Increased muscle spasms, Decreased range of motion, Decreased scar mobility, Decreased activity tolerance, Decreased strength, Increased fascial restricitons, Impaired flexibility, Impaired UE functional use, Postural dysfunction, Pain  Visit Diagnosis: Acute pain of right shoulder  Stiffness of right shoulder, not elsewhere classified  Other muscle spasm  Muscle weakness (generalized)     Problem List Patient Active Problem List   Diagnosis Date Noted   Tendinopathy of rotator cuff, left 04/04/2021   Full thickness rotator cuff tear 03/14/2021    03/16/2021, PTA 06/27/2021, 2:01 PM  [facility] [address]  [address] [place], [place], [place] Phone: [phone]   Fax:  [phone]  Name: Nathaniel Solis MRN: Adriana Mccallum Date of Birth: 1971-05-09

## 2021-07-01 ENCOUNTER — Ambulatory Visit: Payer: Medicaid Other

## 2021-07-01 ENCOUNTER — Other Ambulatory Visit: Payer: Self-pay

## 2021-07-01 DIAGNOSIS — M25511 Pain in right shoulder: Secondary | ICD-10-CM | POA: Diagnosis not present

## 2021-07-01 DIAGNOSIS — M6281 Muscle weakness (generalized): Secondary | ICD-10-CM

## 2021-07-01 DIAGNOSIS — M62838 Other muscle spasm: Secondary | ICD-10-CM

## 2021-07-01 DIAGNOSIS — M25611 Stiffness of right shoulder, not elsewhere classified: Secondary | ICD-10-CM

## 2021-07-01 NOTE — Patient Instructions (Signed)
Access Code: [email or web address] URL: [email or web address] Date: 07/01/2021 Prepared by: Verta Ellen  Exercises Supine Shoulder Flexion Overhead with Dowel - 1 x daily - 7 x weekly - 2 sets - 10 reps Supine Shoulder Flexion AAROM with Hands Clasped - 1 x daily - 7 x weekly - 2 sets - 10 reps Isometric Shoulder Flexion at Wall - 1 x daily - 7 x weekly - 2 sets - 10 reps - 10 sec hold Isometric Shoulder Abduction at Wall - 1 x daily - 7 x weekly - 2 sets - 5 reps - 10 sec hold Shoulder external rotation - 1 x daily - 7 x weekly - 2 sets - 10 reps - 5 sec hold Shoulder Internal Rotation Reactive Isometrics - 1 x daily - 7 x weekly - 2 sets - 10 reps - 5 sec hold

## 2021-07-01 NOTE — Therapy (Signed)
[facility] [address]  [place], [place], [address] Phone: [phone]   Fax:  [phone]  Physical Therapy Treatment  Patient Details  Name: Halid Ormiston MRN: [id number] Date of Birth: 07-Jul-1970 Referring Provider (PT): Tania Ade MD   Encounter Date: 07/01/2021   PT End of Session - 07/01/21 1401     Visit Number 5    Number of Visits 16    Date for PT Re-Evaluation 08/08/21    Authorization Type [employer] M[employer]    PT Start Time [id number]    PT Stop Time 1358    PT Time Calculation (min) 41 min    Activity Tolerance Patient tolerated treatment well    Behavior During Therapy [facility] for tasks assessed/performed             Past Medical History:  Diagnosis Date   Kidney stone     Past Surgical History:  Procedure Laterality Date   APPENDECTOMY      There were no vitals filed for this visit.   Subjective Assessment - 07/01/21 1318     Subjective Pt reports that his trip to [place] was rough but doing ok now.    Currently in Pain? Yes    Pain Score 3     Pain Location Shoulder    Pain Orientation Right    Pain Descriptors / Indicators Aching    Pain Type Surgical pain                               [facility] Adult PT Treatment/Exercise - 07/01/21 0001       Shoulder Exercises: Prone   Retraction AROM;Right;20 reps    Retraction Limitations rows    Extension AROM;Right;20 reps    Extension Limitations to midline      Shoulder Exercises: Standing   External Rotation Strengthening;Right;5 reps;Theraband    Theraband Level (Shoulder External Rotation) Level 1 (Yellow)    External Rotation Limitations isometric step out    Internal Rotation Strengthening;Right;5 reps;Theraband    Theraband Level (Shoulder Internal Rotation) Level 1 (Yellow)    Internal Rotation Limitations isometric step out    Flexion AAROM;Right;15 reps    Flexion Limitations uppercut with cane       Shoulder Exercises: Pulleys   Flexion 2 minutes    Flexion Limitations cues to avoid painful ROM    Scaption 2 minutes    Scaption Limitations cues to avoid painful ROM      Shoulder Exercises: Therapy Ball   Flexion Both;15 reps    Flexion Limitations AAROM with orange Pball    Scaption Both;15 reps    Scaption Limitations AAROM with orange Pball      Shoulder Exercises: Isometric Strengthening   Flexion 5X10"    Flexion Limitations with towel into wall    ABduction 5X10"    ABduction Limitations with towel into wall                     PT Education - 07/01/21 1341     Education Details HEP update and review with patient    Person(s) Educated Patient    Methods Explanation;Demonstration    Comprehension Verbalized understanding;Returned demonstration              PT Short Term Goals - 06/27/21 1400       PT SHORT TERM GOAL #1   Title Ind with initial  HEP    Time 2    Period Weeks    Status Achieved   06/27/21   Target Date 06/27/21               PT Long Term Goals - 06/20/21 1553       PT LONG TERM GOAL #1   Title Pt. will be independent with progressed HEP for RUE strengthening to improve outcomes.    Time 8    Period Weeks    Status On-going    Target Date 08/08/21      PT LONG TERM GOAL #2   Title Pt. will demonstrate full R shoulder ROM without pain in order to perform overhead job activities.    Baseline see flowsheet    Time 8    Period Weeks    Status On-going    Target Date 08/08/21      PT LONG TERM GOAL #3   Title Pt. will demonstrate 5/5 R shoulder strength without pain in order to start lifting weights.    Time 8    Period Weeks    Status On-going    Target Date 08/08/21      PT LONG TERM GOAL #4   Title Pt. will be able to lift 40lbs without increased R shoulder pain to return to work.    Time 8    Period Weeks    Status On-going    Target Date 08/08/21                   Plan - 07/01/21 1401      Clinical Impression Statement Updated HEP today with shoulder isometrics. Continued progressing shoulder ROM per patient's tolerance and protocol. Pt did well with the pulleys today, the only complaint was shoulder fatigue with the ROM exercises. He notes pain with a certain ROM but as he goes past it pain subsides. He is showing good progress.    Personal Factors and Comorbidities Comorbidity 3+    Comorbidities history L RTC repair, COPD, smoker    PT Frequency 2x / week    PT Duration 8 weeks    PT Treatment/Interventions ADLs/Self Care Home Management;Cryotherapy;Electrical Stimulation;Iontophoresis 4mg /ml Dexamethasone;Moist Heat;Ultrasound;Therapeutic activities;Therapeutic exercise;Neuromuscular re-education;Patient/family education;Manual techniques;Scar mobilization;Passive range of motion;Dry needling;Taping;Joint Manipulations    PT Next Visit Plan progress ROM and exercises per protocol.  Post op week 6 as of 06/27/21.    PT Home Exercise Plan Access Code: [email or web address]    Consulted and Agree with Plan of Care Patient             Patient will benefit from skilled therapeutic intervention in order to improve the following deficits and impairments:  Decreased skin integrity, Increased muscle spasms, Decreased range of motion, Decreased scar mobility, Decreased activity tolerance, Decreased strength, Increased fascial restricitons, Impaired flexibility, Impaired UE functional use, Postural dysfunction, Pain  Visit Diagnosis: Acute pain of right shoulder  Stiffness of right shoulder, not elsewhere classified  Other muscle spasm  Muscle weakness (generalized)     Problem List Patient Active Problem List   Diagnosis Date Noted   Tendinopathy of rotator cuff, left 04/04/2021   Full thickness rotator cuff tear 03/14/2021    Artist Pais, PTA 07/01/2021, 2:09 PM  [facility] [address]  [place],  [place], [address] Phone: [phone]   Fax:  [phone]  Name: Zayyan Trimarco MRN: [id number] Date of Birth: 27-Apr-1971

## 2021-07-04 ENCOUNTER — Encounter: Payer: Self-pay | Admitting: Physical Therapy

## 2021-07-04 ENCOUNTER — Ambulatory Visit: Payer: Medicaid Other | Admitting: Physical Therapy

## 2021-07-04 ENCOUNTER — Other Ambulatory Visit: Payer: Self-pay

## 2021-07-04 DIAGNOSIS — M25511 Pain in right shoulder: Secondary | ICD-10-CM | POA: Diagnosis not present

## 2021-07-04 DIAGNOSIS — M62838 Other muscle spasm: Secondary | ICD-10-CM

## 2021-07-04 DIAGNOSIS — M6281 Muscle weakness (generalized): Secondary | ICD-10-CM

## 2021-07-04 DIAGNOSIS — M25611 Stiffness of right shoulder, not elsewhere classified: Secondary | ICD-10-CM

## 2021-07-04 NOTE — Therapy (Signed)
[facility] Outpatient Rehabilitation [facility] [address]  [address] [place], [place], [address] Phone: [phone]   Fax:  [phone]  Physical Therapy Treatment  Patient Details  Name: Nathaniel Solis MRN: [id number] Date of Birth: Aug 16, 1970 Referring Provider (PT): Jones Broom MD   Encounter Date: 07/04/2021   PT End of Session - 07/04/21 1440     Visit Number 6    Number of Visits 16    Date for PT Re-Evaluation 08/08/21    Authorization Type [employer]    PT Start Time 1440    PT Stop Time 1515    PT Time Calculation (min) 35 min    Activity Tolerance Patient tolerated treatment well    Behavior During Therapy [facility] for tasks assessed/performed             Past Medical History:  Diagnosis Date   Kidney stone     Past Surgical History:  Procedure Laterality Date   APPENDECTOMY      There were no vitals filed for this visit.   Subjective Assessment - 07/04/21 1443     Subjective Pt was working under his sister's car yesterday and was pushing on the transmission mainly with left arm and a pry bar and the car fell of the jack onto his hip. Went to ER and xrays negative of hip.    Currently in Pain? Yes    Pain Score 4     Pain Location Shoulder    Pain Orientation Right    Pain Descriptors / Indicators Aching    Pain Type Surgical pain                               [facility] Adult PT Treatment/Exercise - 07/04/21 0001       Neck Exercises: Standing   Other Standing Exercises attempted AROM for flex and ABD but too painful with return to start position      Shoulder Exercises: Prone   Retraction AROM;Right;20 reps    Retraction Limitations rows    Extension AROM;Right;20 reps    Extension Limitations to midline    Horizontal ABduction 1 Right;10 reps    Horizontal ABduction 1 Limitations elbow at 90 deg      Shoulder Exercises: Sidelying   Flexion Right;10 reps;AAROM    Flexion Limitations on Red  Theraball      Shoulder Exercises: Standing   External Rotation Strengthening;Right;Theraband;10 reps    Theraband Level (Shoulder External Rotation) Level 1 (Yellow)    External Rotation Limitations isometric step out    Internal Rotation Strengthening;Right;Theraband;10 reps    Theraband Level (Shoulder Internal Rotation) Level 1 (Yellow)    Internal Rotation Limitations isometric step out      Shoulder Exercises: Pulleys   Flexion 3 minutes;1 minute    Scaption 2 minutes      Shoulder Exercises: Isometric Strengthening   Flexion 5X10"    Flexion Limitations into ball    ABduction 5X10"    ABduction Limitations into ball                     PT Education - 07/04/21 1521     Education Details educated patient regarding overdoing exercises and possible development of tendonitis: advised to drop to 2x/day and if still painful with second set go to every other day; also advised that bands should only be used for isometrics until week 10.  Person(s) Educated Patient    Methods Explanation    Comprehension Verbalized understanding              PT Short Term Goals - 06/27/21 1400       PT SHORT TERM GOAL #1   Title Ind with initial HEP    Time 2    Period Weeks    Status Achieved   06/27/21   Target Date 06/27/21               PT Long Term Goals - 06/20/21 1553       PT LONG TERM GOAL #1   Title Pt. will be independent with progressed HEP for RUE strengthening to improve outcomes.    Time 8    Period Weeks    Status On-going    Target Date 08/08/21      PT LONG TERM GOAL #2   Title Pt. will demonstrate full R shoulder ROM without pain in order to perform overhead job activities.    Baseline see flowsheet    Time 8    Period Weeks    Status On-going    Target Date 08/08/21      PT LONG TERM GOAL #3   Title Pt. will demonstrate 5/5 R shoulder strength without pain in order to start lifting weights.    Time 8    Period Weeks    Status  On-going    Target Date 08/08/21      PT LONG TERM GOAL #4   Title Pt. will be able to lift 40lbs without increased R shoulder pain to return to work.    Time 8    Period Weeks    Status On-going    Target Date 08/08/21                   Plan - 07/04/21 1523     Clinical Impression Statement Patient 10 min late. He reports some increase in pain with isometrics at home by last set. He is likely over-exercising. Advised to drop to 2x/day morning and evening and if still painful to go to every other day to prevent tendonitis. He also was working under a car yesterday and it fell on him. He had to go to ER. They scanned his shoulder and everything looked fine but he may be more sore due to this event.    Comorbidities history L RTC repair, COPD, smoker    PT Treatment/Interventions ADLs/Self Care Home Management;Cryotherapy;Electrical Stimulation;Iontophoresis 4mg /ml Dexamethasone;Moist Heat;Ultrasound;Therapeutic activities;Therapeutic exercise;Neuromuscular re-education;Patient/family education;Manual techniques;Scar mobilization;Passive range of motion;Dry needling;Taping;Joint Manipulations    PT Next Visit Plan progress ROM and exercises per protocol.  Post op week 7 as of 07/04/21.             Patient will benefit from skilled therapeutic intervention in order to improve the following deficits and impairments:  Decreased skin integrity, Increased muscle spasms, Decreased range of motion, Decreased scar mobility, Decreased activity tolerance, Decreased strength, Increased fascial restricitons, Impaired flexibility, Impaired UE functional use, Postural dysfunction, Pain  Visit Diagnosis: Acute pain of right shoulder  Stiffness of right shoulder, not elsewhere classified  Other muscle spasm  Muscle weakness (generalized)     Problem List Patient Active Problem List   Diagnosis Date Noted   Tendinopathy of rotator cuff, left 04/04/2021   Full thickness rotator cuff  tear 03/14/2021    Solon PalmJulie Oneita Allmon, PT 07/04/2021, 3:30 PM  [facility] [address]  [address] [place], [place], [address] Phone: [phone]   Fax:  [phone]  Name: Nathaniel Solis MRN: [id number] Date of Birth: 1970-09-11

## 2021-07-08 ENCOUNTER — Ambulatory Visit: Payer: Medicaid Other

## 2021-07-08 ENCOUNTER — Other Ambulatory Visit: Payer: Self-pay

## 2021-07-08 DIAGNOSIS — M62838 Other muscle spasm: Secondary | ICD-10-CM

## 2021-07-08 DIAGNOSIS — M25611 Stiffness of right shoulder, not elsewhere classified: Secondary | ICD-10-CM

## 2021-07-08 DIAGNOSIS — M25511 Pain in right shoulder: Secondary | ICD-10-CM

## 2021-07-08 DIAGNOSIS — M6281 Muscle weakness (generalized): Secondary | ICD-10-CM

## 2021-07-08 NOTE — Therapy (Signed)
[facility] [address]  [address] [place], [place], [address] Phone: [phone]   Fax:  [phone]  Physical Therapy Treatment  Patient Details  Name: Nathaniel Solis MRN: [id number] Date of Birth: 1971-01-21 Referring Provider (PT): Jones Broom MD   Encounter Date: 07/08/2021   PT End of Session - 07/08/21 1409     Visit Number 7    Number of Visits 16    Date for PT Re-Evaluation 08/08/21    Authorization Type [employer] MCD    PT Start Time 1318    PT Stop Time 1404    PT Time Calculation (min) 46 min    Activity Tolerance Patient tolerated treatment well    Behavior During Therapy [facility] for tasks assessed/performed             Past Medical History:  Diagnosis Date   Kidney stone     Past Surgical History:  Procedure Laterality Date   APPENDECTOMY      There were no vitals filed for this visit.   Subjective Assessment - 07/08/21 1319     Subjective Pt feels like his ROM is improving but lacking strength with his shoulder, still noting some nocturnal pain.    Currently in Pain? Yes    Pain Score 3     Pain Location Shoulder    Pain Orientation Right    Pain Descriptors / Indicators Aching    Pain Type Surgical pain                               [facility] Adult PT Treatment/Exercise - 07/08/21 0001       Shoulder Exercises: Standing   External Rotation Strengthening;Right;10 reps;Theraband    Theraband Level (Shoulder External Rotation) Level 2 (Red)    External Rotation Limitations isometric step out    Internal Rotation Strengthening;Right;Theraband;10 reps    Theraband Level (Shoulder Internal Rotation) Level 2 (Red)    Flexion Strengthening;Right;10 reps;Theraband    Theraband Level (Shoulder Flexion) Level 2 (Red)    Flexion Limitations isometric step out    ABduction Strengthening;Right;10 reps;Theraband    Theraband Level (Shoulder ABduction) Level 2 (Red)     Extension AROM;Right;10 reps;Theraband    Theraband Level (Shoulder Extension) Level 2 (Red)    Other Standing Exercises standing shoulder flexion and ABD with cane 10x each      Shoulder Exercises: Pulleys   Flexion 3 minutes    Scaption 3 minutes      Manual Therapy   Manual Therapy Soft tissue mobilization;Myofascial release    Soft tissue mobilization STM to R UT, LS    Myofascial Release TPR to R UT, LS.                       PT Short Term Goals - 06/27/21 1400       PT SHORT TERM GOAL #1   Title Ind with initial HEP    Time 2    Period Weeks    Status Achieved   06/27/21   Target Date 06/27/21               PT Long Term Goals - 06/20/21 1553       PT LONG TERM GOAL #1   Title Pt. will be independent with progressed HEP for RUE strengthening to improve outcomes.    Time 8    Period Weeks  Status On-going    Target Date 08/08/21      PT LONG TERM GOAL #2   Title Pt. will demonstrate full R shoulder ROM without pain in order to perform overhead job activities.    Baseline see flowsheet    Time 8    Period Weeks    Status On-going    Target Date 08/08/21      PT LONG TERM GOAL #3   Title Pt. will demonstrate 5/5 R shoulder strength without pain in order to start lifting weights.    Time 8    Period Weeks    Status On-going    Target Date 08/08/21      PT LONG TERM GOAL #4   Title Pt. will be able to lift 40lbs without increased R shoulder pain to return to work.    Time 8    Period Weeks    Status On-going    Target Date 08/08/21                   Plan - 07/08/21 1410     Clinical Impression Statement Pt noted some muscle tension in his R UT and levator so we did some STM to those areas today. Progressed isometrics today with increased resistance and reinforced the importance of avoiding doing strengthening exercises everyday to avoid tendinitis. Pt continues to respond well to treatment with no increased pain.    Personal  Factors and Comorbidities Comorbidity 3+    Comorbidities history L RTC repair, COPD, smoker    PT Frequency 2x / week    PT Duration 8 weeks    PT Treatment/Interventions ADLs/Self Care Home Management;Cryotherapy;Electrical Stimulation;Iontophoresis 4mg /ml Dexamethasone;Moist Heat;Ultrasound;Therapeutic activities;Therapeutic exercise;Neuromuscular re-education;Patient/family education;Manual techniques;Scar mobilization;Passive range of motion;Dry needling;Taping;Joint Manipulations    PT Next Visit Plan progress ROM and exercises per protocol.  Post op week 7 as of 07/04/21.    PT Home Exercise Plan Access Code: [email or web address]    Consulted and Agree with Plan of Care Patient             Patient will benefit from skilled therapeutic intervention in order to improve the following deficits and impairments:  Decreased skin integrity, Increased muscle spasms, Decreased range of motion, Decreased scar mobility, Decreased activity tolerance, Decreased strength, Increased fascial restricitons, Impaired flexibility, Impaired UE functional use, Postural dysfunction, Pain  Visit Diagnosis: Acute pain of right shoulder  Stiffness of right shoulder, not elsewhere classified  Other muscle spasm  Muscle weakness (generalized)     Problem List Patient Active Problem List   Diagnosis Date Noted   Tendinopathy of rotator cuff, left 04/04/2021   Full thickness rotator cuff tear 03/14/2021    03/16/2021, PTA 07/08/2021, 2:11 PM  [facility] [address]  [address] [place], [place], [place] Phone: [phone]   Fax:  [phone]  Name: Diamante Rubin MRN: Adriana Mccallum Date of Birth: Feb 04, 1971

## 2021-07-11 ENCOUNTER — Other Ambulatory Visit: Payer: Self-pay

## 2021-07-11 ENCOUNTER — Encounter: Payer: Self-pay | Admitting: Physical Therapy

## 2021-07-11 ENCOUNTER — Ambulatory Visit: Payer: Medicaid Other | Admitting: Physical Therapy

## 2021-07-11 DIAGNOSIS — M6281 Muscle weakness (generalized): Secondary | ICD-10-CM

## 2021-07-11 DIAGNOSIS — M25511 Pain in right shoulder: Secondary | ICD-10-CM

## 2021-07-11 DIAGNOSIS — M62838 Other muscle spasm: Secondary | ICD-10-CM

## 2021-07-11 DIAGNOSIS — M25611 Stiffness of right shoulder, not elsewhere classified: Secondary | ICD-10-CM

## 2021-07-11 NOTE — Therapy (Signed)
[facility] [address]  [place], [place], [address] Phone: [phone]   Fax:  [phone]  Physical Therapy Treatment  Patient Details  Name: Nathaniel Solis MRN: [id number] Date of Birth: 25-Dec-1970 Referring Provider (PT): Tania Ade MD   Encounter Date: 07/11/2021   PT End of Session - 07/11/21 1348     Visit Number 8    Number of Visits 16    Date for PT Re-Evaluation 08/08/21    Authorization Type [employer] M[employer]    PT Start Time 1348    PT Stop Time 1430    PT Time Calculation (min) 42 min    Activity Tolerance Patient tolerated treatment well    Behavior During Therapy [facility] for tasks assessed/performed             Past Medical History:  Diagnosis Date   Kidney stone     Past Surgical History:  Procedure Laterality Date   APPENDECTOMY      There were no vitals filed for this visit.   Subjective Assessment - 07/11/21 1349     Subjective Patient reporting good response to STM last visit. Patient reporting constant burning "like road rash" for the past couple of weeks in biceps and pectorals.    Currently in Pain? Yes    Pain Score 3     Pain Location Shoulder    Pain Orientation Right    Pain Descriptors / Indicators Aching                               [facility] Adult PT Treatment/Exercise - 07/11/21 0001       Neck Exercises: Standing   Other Standing Exercises --      Shoulder Exercises: Supine   External Rotation AROM;15 reps    External Rotation Limitations at 45 deg ABD      Shoulder Exercises: Standing   External Rotation AROM;10 reps;Both    External Rotation Limitations bil    Internal Rotation AROM;10 reps;Both    Internal Rotation Limitations hands behind back    Extension AROM;Right;10 reps    Other Standing Exercises Worked on AROM in standing flex/scapton in pain free range (less than 90 deg); then moved to bent over position with counter  support; 10 reps each active flex/scaption/ABD      Shoulder Exercises: Pulleys   Flexion 3 minutes    Scaption 3 minutes      Shoulder Exercises: ROM/Strengthening   Wall Wash flex and scaption x 10 ea      Manual Therapy   Manual Therapy Soft tissue mobilization;Myofascial release    Manual therapy comments skilled palpation and monitoring of soft tissues during DN    Soft tissue mobilization to right UT and levator    Myofascial Release TPR to R UT              Trigger Point Dry Needling - 07/11/21 0001     Consent Given? Yes    Education Handout Provided Previously provided    Muscles Treated Head and Neck Upper trapezius    Dry Needling Comments right    Upper Trapezius Response Twitch reponse elicited;Palpable increased muscle length                   PT Education - 07/11/21 1550     Education Details educated pt on desensitization techniques using fabric    Person(s) Educated Patient  Methods Explanation    Comprehension Verbalized understanding              PT Short Term Goals - 06/27/21 1400       PT SHORT TERM GOAL #1   Title Ind with initial HEP    Time 2    Period Weeks    Status Achieved   06/27/21   Target Date 06/27/21               PT Long Term Goals - 07/11/21 1536       PT LONG TERM GOAL #1   Title Pt. will be independent with progressed HEP for RUE strengthening to improve outcomes.    Status On-going      PT LONG TERM GOAL #2   Title Pt. will demonstrate full R shoulder ROM without pain in order to perform overhead job activities.    Status On-going      PT LONG TERM GOAL #3   Title Pt. will demonstrate 5/5 R shoulder strength without pain in order to start lifting weights.    Status On-going      PT LONG TERM GOAL #4   Title Pt. will be able to lift 40lbs without increased R shoulder pain to return to work.    Status On-going                   Plan - 07/11/21 1537     Clinical Impression  Statement Patient reporting significant relief after MT last visit. He is now able to move his head into left lateral flexion. Still reporting a knot in the right UT. Good response to DN to Rt UT today. He was able to complete AROM in all planes today in limited range. Flex/Scap/ABD had to be completed in a forward flexed position to avoid catching on return to side. Pt also reports that overall pain is better since decreasing frequency of isometrics at home.    PT Frequency 2x / week    PT Duration 8 weeks    PT Treatment/Interventions ADLs/Self Care Home Management;Cryotherapy;Electrical Stimulation;Iontophoresis 4mg /ml Dexamethasone;Moist Heat;Ultrasound;Therapeutic activities;Therapeutic exercise;Neuromuscular re-education;Patient/family education;Manual techniques;Scar mobilization;Passive range of motion;Dry needling;Taping;Joint Manipulations    PT Next Visit Plan progress ROM and exercises per protocol.  Post op week 8 as of 07/11/21.    Consulted and Agree with Plan of Care Patient             Patient will benefit from skilled therapeutic intervention in order to improve the following deficits and impairments:  Decreased skin integrity, Increased muscle spasms, Decreased range of motion, Decreased scar mobility, Decreased activity tolerance, Decreased strength, Increased fascial restricitons, Impaired flexibility, Impaired UE functional use, Postural dysfunction, Pain  Visit Diagnosis: Acute pain of right shoulder  Stiffness of right shoulder, not elsewhere classified  Other muscle spasm  Muscle weakness (generalized)     Problem List Patient Active Problem List   Diagnosis Date Noted   Tendinopathy of rotator cuff, left 04/04/2021   Full thickness rotator cuff tear 03/14/2021   Madelyn Flavors, PT 07/11/2021, 3:51 PM  [facility] [address]  [place], [place], [address] Phone: [phone]   Fax:   [phone]  Name: Nathaniel Solis MRN: [id number] Date of Birth: [birth year]

## 2021-07-15 ENCOUNTER — Ambulatory Visit: Payer: Medicaid Other

## 2021-07-18 ENCOUNTER — Ambulatory Visit: Payer: Medicaid Other | Attending: Orthopedic Surgery | Admitting: Physical Therapy

## 2021-07-18 ENCOUNTER — Encounter: Payer: Self-pay | Admitting: Physical Therapy

## 2021-07-18 ENCOUNTER — Other Ambulatory Visit: Payer: Self-pay

## 2021-07-18 DIAGNOSIS — M62838 Other muscle spasm: Secondary | ICD-10-CM | POA: Diagnosis present

## 2021-07-18 DIAGNOSIS — M25511 Pain in right shoulder: Secondary | ICD-10-CM | POA: Insufficient documentation

## 2021-07-18 DIAGNOSIS — M6281 Muscle weakness (generalized): Secondary | ICD-10-CM | POA: Diagnosis present

## 2021-07-18 DIAGNOSIS — M25611 Stiffness of right shoulder, not elsewhere classified: Secondary | ICD-10-CM | POA: Diagnosis present

## 2021-07-18 NOTE — Therapy (Signed)
[facility] [address]  [place], [place], [address] Phone: [phone]   Fax:  [phone]  Physical Therapy Treatment Progress Note Reporting Period 06/13/2021 to 07/18/2021  See note below for Objective Data and Assessment of Progress/Goals.      Patient Details  Name: Nathaniel Solis MRN: [id number] Date of Birth: 12-Feb-1971 Referring Provider (PT): Tania Ade MD   Encounter Date: 07/18/2021   PT End of Session - 07/18/21 1458     Visit Number 9    Number of Visits 16    Date for PT Re-Evaluation 08/08/21    Authorization Type [employer] M[employer]    PT Start Time 1451    PT Stop Time 1529    PT Time Calculation (min) 38 min    Activity Tolerance Patient tolerated treatment well    Behavior During Therapy [facility] for tasks assessed/performed             Past Medical History:  Diagnosis Date   Kidney stone     Past Surgical History:  Procedure Laterality Date   APPENDECTOMY      There were no vitals filed for this visit.   Subjective Assessment - 07/18/21 1506     Subjective Pt. reports only doing exercises 2x/day rather than 3x/day has helped pain significantly.    Currently in Pain? Yes    Pain Score 3     Pain Location Shoulder    Pain Orientation Right    Pain Descriptors / Indicators Aching                [facility] PT Assessment - 07/18/21 0001       Assessment   Medical Diagnosis Z98.890 (ICD-10-CM) - S/P rotator cuff repair    Referring Provider (PT) Tania Ade MD    Onset Date/Surgical Date 05/16/21    Hand Dominance Right      Precautions   Precautions Shoulder    Type of Shoulder Precautions see protocol for RTC repair      AROM   Right Shoulder Extension 50 Degrees    Right Shoulder Flexion 175 Degrees    Right Shoulder ABduction 140 Degrees    Right Shoulder Internal Rotation --   functional to T10   Right Shoulder External Rotation --   functional to nape of  neck     PROM   PROM Assessment Site Shoulder    Right/Left Shoulder Right    Right Shoulder Flexion 160 Degrees   limited by pain   Right Shoulder ABduction 140 Degrees    Right Shoulder Internal Rotation 65 Degrees    Right Shoulder External Rotation 85 Degrees                           [facility] Adult PT Treatment/Exercise - 07/18/21 0001       Shoulder Exercises: Supine   Flexion AROM;Right;15 reps    Flexion Limitations flexing to full ROM but then stopping on return at about 50deg to avoid painful ROM      Shoulder Exercises: Standing   Internal Rotation AROM;10 reps;Both      Shoulder Exercises: Pulleys   Flexion 3 minutes    Scaption 3 minutes      Shoulder Exercises: ROM/Strengthening   Wall Wash flex and scaption x 20 ea    Ball on Wall CW and CCW x 15    Rhythmic Stabilization, Supine 3 x 1 min, light taps  Manual Therapy   Manual Therapy Soft tissue mobilization;Passive ROM;Myofascial release    Manual therapy comments to improve ROM and decrease spasm, supine    Soft tissue mobilization STM to R shoulder    Myofascial Release TPR to R UT    Passive ROM R shoulder into internal rotation                       PT Short Term Goals - 06/27/21 1400       PT SHORT TERM GOAL #1   Title Ind with initial HEP    Time 2    Period Weeks    Status Achieved   06/27/21   Target Date 06/27/21               PT Long Term Goals - 07/18/21 1542       PT LONG TERM GOAL #1   Title Pt. will be independent with progressed HEP for RUE strengthening to improve outcomes.    Status On-going   07/18/21- met for current.   Target Date 08/08/21      PT LONG TERM GOAL #2   Title Pt. will demonstrate full R shoulder ROM without pain in order to perform overhead job activities.    Status On-going   07/18/21- progressing, see flowsheet.  Pain with eccentric lowering   Target Date 08/08/21      PT LONG TERM GOAL #3   Title Pt. will demonstrate  5/5 R shoulder strength without pain in order to start lifting weights.    Status On-going   07/18/21- progressing, 3+/5 R shoulder able to raise against gravity now.   Target Date 08/08/21      PT LONG TERM GOAL #4   Title Pt. will be able to lift 40lbs without increased R shoulder pain to return to work.    Status On-going   07/18/21- not yet appropriate to test.   Target Date 08/08/21                   Plan - 07/18/21 1803     Clinical Impression Statement Patient reports significant improvement in pain in R shoulder.  He demonstrates improved active shoulder flexion but lowering arm (eccentric) still causes increased pain, so focused on performing exercise in supine rather than standing, or continuing slight AAROM for descent using pulley or wall.  His AROM overall has improved significant and is close to functional.  He would benefit from continued physical therapy to continue strengthening in order to be able to return to work.    Personal Factors and Comorbidities Comorbidity 3+    Comorbidities history L RTC repair, COPD, smoker    Examination-Activity Limitations Carry;Bend;Lift;Reach Overhead;Caring for Others;Sleep    Examination-Participation Restrictions Occupation;Interpersonal Relationship;Cleaning;Community Activity;Driving;Meal Prep    PT Frequency 2x / week    PT Duration 8 weeks    PT Treatment/Interventions ADLs/Self Care Home Management;Cryotherapy;Electrical Stimulation;Iontophoresis 86m/ml Dexamethasone;Moist Heat;Ultrasound;Therapeutic activities;Therapeutic exercise;Neuromuscular re-education;Patient/family education;Manual techniques;Scar mobilization;Passive range of motion;Dry needling;Taping;Joint Manipulations    PT Next Visit Plan progress ROM and exercises per protocol.  Post op week 8 as of 07/11/21.    Consulted and Agree with Plan of Care Patient             Patient will benefit from skilled therapeutic intervention in order to improve the  following deficits and impairments:  Decreased skin integrity, Increased muscle spasms, Decreased range of motion, Decreased scar mobility, Decreased activity tolerance, Decreased strength, Increased fascial restricitons,  Impaired flexibility, Impaired UE functional use, Postural dysfunction, Pain  Visit Diagnosis: Acute pain of right shoulder  Stiffness of right shoulder, not elsewhere classified  Other muscle spasm  Muscle weakness (generalized)     Problem List Patient Active Problem List   Diagnosis Date Noted   Tendinopathy of rotator cuff, left 04/04/2021   Full thickness rotator cuff tear 03/14/2021    Rennie Natter, PT, DPT  07/18/2021, 6:08 PM  [facility] [address]  [place], [place], [address] Phone: [phone]   Fax:  [phone]  Name: Nathaniel Solis MRN: [id number] Date of Birth: [birth year]

## 2021-07-22 ENCOUNTER — Ambulatory Visit: Payer: Medicaid Other

## 2021-07-22 ENCOUNTER — Other Ambulatory Visit: Payer: Self-pay

## 2021-07-22 DIAGNOSIS — M62838 Other muscle spasm: Secondary | ICD-10-CM

## 2021-07-22 DIAGNOSIS — M6281 Muscle weakness (generalized): Secondary | ICD-10-CM

## 2021-07-22 DIAGNOSIS — M25511 Pain in right shoulder: Secondary | ICD-10-CM | POA: Diagnosis not present

## 2021-07-22 DIAGNOSIS — M25611 Stiffness of right shoulder, not elsewhere classified: Secondary | ICD-10-CM

## 2021-07-22 NOTE — Therapy (Signed)
[facility] [address]  [place], [place], [address] Phone: [phone]   Fax:  [phone]  Physical Therapy Treatment  Patient Details  Name: Jayren Cease MRN: [id number] Date of Birth: [birth year] Referring Provider (PT): Tania Ade MD   Encounter Date: 07/22/2021   PT End of Session - 07/22/21 1405     Visit Number 10    Number of Visits 16    Date for PT Re-Evaluation 08/08/21    Authorization Type [employer]    Authorization Time Period 06/13/21-08/08/21    Authorization - Visit Number 10    PT Start Time 9937    PT Stop Time 1403    PT Time Calculation (min) 46 min    Activity Tolerance Patient tolerated treatment well    Behavior During Therapy [facility] for tasks assessed/performed             Past Medical History:  Diagnosis Date   Kidney stone     Past Surgical History:  Procedure Laterality Date   APPENDECTOMY      There were no vitals filed for this visit.   Subjective Assessment - 07/22/21 1319     Subjective "My shoulder is a little stiffer today, long car rides always make it stiffer.    Currently in Pain? Yes    Pain Score 2     Pain Location Shoulder    Pain Orientation Right    Pain Descriptors / Indicators Aching    Pain Type Surgical pain                               [facility] Adult PT Treatment/Exercise - 07/22/21 0001       Shoulder Exercises: Supine   Flexion AROM;Right;Weights;20 reps    Shoulder Flexion Weight (lbs) 1    Flexion Limitations to 90 deg      Shoulder Exercises: Sidelying   External Rotation AROM;Right;10 reps      Shoulder Exercises: Standing   External Rotation Strengthening;Right;10 reps;Theraband    Theraband Level (Shoulder External Rotation) Level 2 (Red)    External Rotation Limitations isometric step out    Internal Rotation Strengthening;Right;Theraband;10 reps    Theraband Level (Shoulder Internal Rotation) Level  2 (Red)    Internal Rotation Limitations isometric step out    Other Standing Exercises Ys standing against wall 2x10 reps, to 80 deg      Shoulder Exercises: Pulleys   Flexion 3 minutes    Scaption 3 minutes      Shoulder Exercises: ROM/Strengthening   Wall Wash flexion 20x /cross body 10x      Manual Therapy   Manual Therapy Soft tissue mobilization    Soft tissue mobilization STM to R UT, LS, rhomboids                       PT Short Term Goals - 06/27/21 1400       PT SHORT TERM GOAL #1   Title Ind with initial HEP    Time 2    Period Weeks    Status Achieved   06/27/21   Target Date 06/27/21               PT Long Term Goals - 07/18/21 1542       PT LONG TERM GOAL #1   Title Pt. will be independent with progressed HEP for RUE strengthening to  improve outcomes.    Status On-going   07/18/21- met for current.   Target Date 08/08/21      PT LONG TERM GOAL #2   Title Pt. will demonstrate full R shoulder ROM without pain in order to perform overhead job activities.    Status On-going   07/18/21- progressing, see flowsheet.  Pain with eccentric lowering   Target Date 08/08/21      PT LONG TERM GOAL #3   Title Pt. will demonstrate 5/5 R shoulder strength without pain in order to start lifting weights.    Status On-going   07/18/21- progressing, 3+/5 R shoulder able to raise against gravity now.   Target Date 08/08/21      PT LONG TERM GOAL #4   Title Pt. will be able to lift 40lbs without increased R shoulder pain to return to work.    Status On-going   07/18/21- not yet appropriate to test.   Target Date 08/08/21                   Plan - 07/22/21 1406     Clinical Impression Statement Pt still notices some "catching" in the R shoulder with eccentric lowering from ~120 deg of flexion. We kept OH raising to below 90 deg for pain free ROM. All other exercises were well tolerated with min pain. He did report more stiffness in his shoulder today after  a long car ride over the weekend. Good response to MT and no complaints post session.    Personal Factors and Comorbidities Comorbidity 3+    Comorbidities history L RTC repair, COPD, smoker    PT Frequency 2x / week    PT Duration 8 weeks    PT Treatment/Interventions ADLs/Self Care Home Management;Cryotherapy;Electrical Stimulation;Iontophoresis 10m/ml Dexamethasone;Moist Heat;Ultrasound;Therapeutic activities;Therapeutic exercise;Neuromuscular re-education;Patient/family education;Manual techniques;Scar mobilization;Passive range of motion;Dry needling;Taping;Joint Manipulations    PT Next Visit Plan progress ROM and exercises per protocol.  Post op week 9 as of 07/18/21.    PT Home Exercise Plan Access Code: [email or web address]   Consulted and Agree with Plan of Care Patient             Patient will benefit from skilled therapeutic intervention in order to improve the following deficits and impairments:  Decreased skin integrity, Increased muscle spasms, Decreased range of motion, Decreased scar mobility, Decreased activity tolerance, Decreased strength, Increased fascial restricitons, Impaired flexibility, Impaired UE functional use, Postural dysfunction, Pain  Visit Diagnosis: Acute pain of right shoulder  Stiffness of right shoulder, not elsewhere classified  Other muscle spasm  Muscle weakness (generalized)     Problem List Patient Active Problem List   Diagnosis Date Noted   Tendinopathy of rotator cuff, left 04/04/2021   Full thickness rotator cuff tear 03/14/2021    BArtist Pais PTA 07/22/2021, 2:18 PM  [facility] [place] [address]Phone: [phone]  Fax:  [phone] Name: MVimal DeregoMRN: [id number] of Birth: 301/03/1971

## 2021-07-25 ENCOUNTER — Other Ambulatory Visit: Payer: Self-pay

## 2021-07-25 ENCOUNTER — Ambulatory Visit: Payer: Medicaid Other | Admitting: Physical Therapy

## 2021-07-25 ENCOUNTER — Encounter: Payer: Self-pay | Admitting: Physical Therapy

## 2021-07-25 DIAGNOSIS — M62838 Other muscle spasm: Secondary | ICD-10-CM

## 2021-07-25 DIAGNOSIS — M25511 Pain in right shoulder: Secondary | ICD-10-CM

## 2021-07-25 DIAGNOSIS — M6281 Muscle weakness (generalized): Secondary | ICD-10-CM

## 2021-07-25 DIAGNOSIS — M25611 Stiffness of right shoulder, not elsewhere classified: Secondary | ICD-10-CM

## 2021-07-25 NOTE — Therapy (Signed)
[facility] [address]  [place], [place], [address] Phone: [phone]   Fax:  [phone]  Physical Therapy Treatment  Patient Details  Name: Nathaniel Solis MRN: [id number] Date of Birth: [birth year] Referring Provider (PT): Tania Ade MD   Encounter Date: 07/25/2021   PT End of Session - 07/25/21 1454     Visit Number 11    Number of Visits 16    Date for PT Re-Evaluation 08/08/21    Authorization Type [employer] M[employer]    Authorization Time Period 06/13/21-08/08/21    Authorization - Visit Number 10    PT Start Time 1452    PT Stop Time 1530    PT Time Calculation (min) 38 min    Activity Tolerance Patient tolerated treatment well    Behavior During Therapy [facility] for tasks assessed/performed             Past Medical History:  Diagnosis Date   Kidney stone     Past Surgical History:  Procedure Laterality Date   APPENDECTOMY      There were no vitals filed for this visit.   Subjective Assessment - 07/25/21 1453     Subjective Don't seem to hurt as bad now when I'm doing something, notice pain more when I'm just sitting.  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    Currently in Pain? Yes    Pain Score 2     Pain Location Shoulder    Pain Orientation Right                [facility] PT Assessment - 07/25/21 0001       Assessment   Medical Diagnosis Z98.890 (ICD-10-CM) - S/P rotator cuff repair    Referring Provider (PT) Tania Ade MD    Onset Date/Surgical Date 05/16/21    Hand Dominance Right      Precautions   Precautions Shoulder    Type of Shoulder Precautions see protocol for RTC repair      AROM   Right Shoulder Extension 60 Degrees    Right Shoulder Flexion 160 Degrees    Right Shoulder ABduction 165 Degrees    Left Shoulder Extension 60 Degrees    Left Shoulder Flexion 140 Degrees    Left Shoulder ABduction 135 Degrees                           [facility]  Adult PT Treatment/Exercise - 07/25/21 0001       Shoulder Exercises: Prone   Retraction Strengthening;Right;20 reps;Weights    Retraction Weight (lbs) 1#    Retraction Limitations prone rows    Flexion Strengthening;Right;20 reps;Weights    Flexion Weight (lbs) 1#    Extension Strengthening;Right;20 reps;Weights    Extension Weight (lbs) #1    Horizontal ABduction 1 Strengthening;Right;20 reps;Weights    Horizontal ABduction 1 Weight (lbs) 1#      Shoulder Exercises: Sidelying   External Rotation Strengthening;Right;20 reps;Weights    External Rotation Weight (lbs) 1#      Shoulder Exercises: Pulleys   Flexion 3 minutes    Scaption 3 minutes      Shoulder Exercises: ROM/Strengthening   Wall Wash flexion x 10, scaption x 10    Ball on Wall CW and CCW 3 x 15                     PT Education - 07/25/21 1534  Education Details HEP update, Access Code: [email or web address].  Educated that his is not supposed to be doing any lifting more than 5lbs with R arm and no overhead lifting still.    Person(s) Educated Patient    Methods Explanation;Demonstration;Verbal cues;Handout    Comprehension Verbalized understanding;Returned demonstration              PT Short Term Goals - 06/27/21 1400       PT SHORT TERM GOAL #1   Title Ind with initial HEP    Time 2    Period Weeks    Status Achieved   06/27/21   Target Date 06/27/21               PT Long Term Goals - 07/18/21 1542       PT LONG TERM GOAL #1   Title Pt. will be independent with progressed HEP for RUE strengthening to improve outcomes.    Status On-going   07/18/21- met for current.   Target Date 08/08/21      PT LONG TERM GOAL #2   Title Pt. will demonstrate full R shoulder ROM without pain in order to perform overhead job activities.    Status On-going   07/18/21- progressing, see flowsheet.  Pain with eccentric lowering   Target Date 08/08/21      PT LONG TERM GOAL #3   Title Pt. will demonstrate  5/5 R shoulder strength without pain in order to start lifting weights.    Status On-going   07/18/21- progressing, 3+/5 R shoulder able to raise against gravity now.   Target Date 08/08/21      PT LONG TERM GOAL #4   Title Pt. will be able to lift 40lbs without increased R shoulder pain to return to work.    Status On-going   07/18/21- not yet appropriate to test.   Target Date 08/08/21                   Plan - 07/25/21 1532     Clinical Impression Statement Pt. is making good progress and demonstrates AROM in R shoulder better than L shoulder now.  Still has weakness with lowering arm.  Today progressed to phase 3 of protocol as he is now 10 weeks post op, progressed exercises for shoulder strengthening in prone with 1# weights, cautioned to not lift more than 5 lbs still and no overhead lifting.  Given copy of protocol as well as he lost his.  He would benefit from continued skilled therapy.    Personal Factors and Comorbidities Comorbidity 3+    Comorbidities history L RTC repair, COPD, smoker    PT Frequency 2x / week    PT Duration 8 weeks    PT Treatment/Interventions ADLs/Self Care Home Management;Cryotherapy;Electrical Stimulation;Iontophoresis 73m/ml Dexamethasone;Moist Heat;Ultrasound;Therapeutic activities;Therapeutic exercise;Neuromuscular re-education;Patient/family education;Manual techniques;Scar mobilization;Passive range of motion;Dry needling;Taping;Joint Manipulations    PT Next Visit Plan progress ROM and exercises per protocol.  Post op week 10 as of 07/25/21.    PT Home Exercise Plan Access Code: [email or web address]   Consulted and Agree with Plan of Care Patient             Patient will benefit from skilled therapeutic intervention in order to improve the following deficits and impairments:  Decreased skin integrity, Increased muscle spasms, Decreased range of motion, Decreased scar mobility, Decreased activity tolerance, Decreased strength, Increased fascial  restricitons, Impaired flexibility, Impaired UE functional use, Postural dysfunction, Pain  Visit Diagnosis: Acute pain of right  shoulder  Stiffness of right shoulder, not elsewhere classified  Other muscle spasm  Muscle weakness (generalized)     Problem List Patient Active Problem List   Diagnosis Date Noted   Tendinopathy of rotator cuff, left 04/04/2021   Full thickness rotator cuff tear 03/14/2021    Rennie Natter, PT, DPT  07/25/2021, 3:37 PM  [facility] [address]  [place], [place], [address] Phone: [phone]   Fax:  [phone]  Name: Nathaniel Solis MRN: [id number] Date of Birth: 1970-06-26

## 2021-07-25 NOTE — Patient Instructions (Signed)
Access Code: [email or web address] URL: [email or web address] Date: 07/25/2021 Prepared by: Harrie Foreman  Exercises Prone Shoulder Row - 1 x daily - 3 x weekly - 3 sets - 10 reps Prone Shoulder Extension - Single Arm with Dumbbell - 1 x daily - 3 x weekly - 3 sets - 10 reps Prone Shoulder Flexion with Dumbbell - 1 x daily - 3 x weekly - 3 sets - 10 reps Prone Single Arm Shoulder Horizontal Abduction with Dumbbell - 1 x daily - 3 x weekly - 3 sets - 10 reps Sidelying Shoulder ER with Towel and Dumbbell - 1 x daily - 3 x weekly - 3 sets - 10 reps Standing Wall Ball Circles with Mini Swiss Ball - 1 x daily - 7 x weekly - 3 sets - 10 reps Shoulder Flexion Wall Slide with Towel - 1 x daily - 7 x weekly - 3 sets - 10 reps Scaption Wall Slide with Towel - 1 x daily - 7 x weekly - 3 sets - 10 reps

## 2021-07-29 ENCOUNTER — Ambulatory Visit: Payer: Medicaid Other

## 2021-07-29 ENCOUNTER — Other Ambulatory Visit: Payer: Self-pay

## 2021-07-29 DIAGNOSIS — M6281 Muscle weakness (generalized): Secondary | ICD-10-CM

## 2021-07-29 DIAGNOSIS — M25511 Pain in right shoulder: Secondary | ICD-10-CM

## 2021-07-29 DIAGNOSIS — M25611 Stiffness of right shoulder, not elsewhere classified: Secondary | ICD-10-CM

## 2021-07-29 DIAGNOSIS — M62838 Other muscle spasm: Secondary | ICD-10-CM

## 2021-07-29 NOTE — Therapy (Signed)
[facility] [address]  [place], [place], [address] Phone: [phone]   Fax:  [phone]  Physical Therapy Treatment  Patient Details  Name: Nathaniel Solis MRN: [id number] Date of Birth: 1971-02-10 Referring Provider (PT): Tania Ade MD   Encounter Date: 07/29/2021   PT End of Session - 07/29/21 1402     Visit Number 12    Number of Visits 16    Date for PT Re-Evaluation 08/08/21    Authorization Type [employer] M[employer]    Authorization Time Period 06/13/21-08/08/21    Authorization - Visit Number 12    PT Start Time 1314    PT Stop Time 1357    PT Time Calculation (min) 43 min    Activity Tolerance Patient tolerated treatment well    Behavior During Therapy [facility] for tasks assessed/performed             Past Medical History:  Diagnosis Date   Kidney stone     Past Surgical History:  Procedure Laterality Date   APPENDECTOMY      There were no vitals filed for this visit.   Subjective Assessment - 07/29/21 1316     Subjective "My shoulder feels like it's getting better, still just a little pain.    Currently in Pain? Yes    Pain Score 2     Pain Location Shoulder    Pain Orientation Right    Pain Descriptors / Indicators Aching    Pain Type Surgical pain;Acute pain                               [facility] Adult PT Treatment/Exercise - 07/29/21 0001       Shoulder Exercises: Prone   Retraction Strengthening;Right;20 reps;Weights    Retraction Weight (lbs) 2    Retraction Limitations prone rows    Extension Strengthening;Right;20 reps;Weights    Extension Weight (lbs) 2    Horizontal ABduction 1 Strengthening;Right;10 reps;Weights    Horizontal ABduction 1 Weight (lbs) 2      Shoulder Exercises: Sidelying   External Rotation Strengthening;Right;15 reps;Weights    External Rotation Weight (lbs) 2      Shoulder Exercises: Standing   Internal Rotation  Strengthening;Right;20 reps;Theraband    Theraband Level (Shoulder Internal Rotation) Level 1 (Yellow)    Internal Rotation Limitations concentric with TB    Flexion AROM;Both;10 reps;Weights    Shoulder Flexion Weight (lbs) 1    Extension Strengthening;Both;20 reps;Theraband    Theraband Level (Shoulder Extension) Level 2 (Red)    Row Strengthening;Both;20 reps;Theraband    Theraband Level (Shoulder Row) Level 2 (Red)      Shoulder Exercises: Pulleys   Flexion 3 minutes    ABduction 3 minutes      Manual Therapy   Manual Therapy Soft tissue mobilization    Soft tissue mobilization STM to R biceps, ant deltoid, TPR to R biceps with PROM                       PT Short Term Goals - 06/27/21 1400       PT SHORT TERM GOAL #1   Title Ind with initial HEP    Time 2    Period Weeks    Status Achieved   06/27/21   Target Date 06/27/21               PT Long Term Goals -  07/18/21 1542       PT LONG TERM GOAL #1   Title Pt. will be independent with progressed HEP for RUE strengthening to improve outcomes.    Status On-going   07/18/21- met for current.   Target Date 08/08/21      PT LONG TERM GOAL #2   Title Pt. will demonstrate full R shoulder ROM without pain in order to perform overhead job activities.    Status On-going   07/18/21- progressing, see flowsheet.  Pain with eccentric lowering   Target Date 08/08/21      PT LONG TERM GOAL #3   Title Pt. will demonstrate 5/5 R shoulder strength without pain in order to start lifting weights.    Status On-going   07/18/21- progressing, 3+/5 R shoulder able to raise against gravity now.   Target Date 08/08/21      PT LONG TERM GOAL #4   Title Pt. will be able to lift 40lbs without increased R shoulder pain to return to work.    Status On-going   07/18/21- not yet appropriate to test.   Target Date 08/08/21                   Plan - 07/29/21 1402     Clinical Impression Statement Pt stil demonstrates  increased shoulder pain when descending from OH ROM. Had no complaints with the periscapular/RTC strengthening today. We did progress with prone exercises to 2lb, he did have fatigue after these exercises. MT was done at the end to the biceps and anterior shoulder, this seemed to improve his eccentric lowering of the R shoulder with flexion so maybe try some more STM or DN to this area.    Personal Factors and Comorbidities Comorbidity 3+    Comorbidities history L RTC repair, COPD, smoker    PT Frequency 2x / week    PT Duration 8 weeks    PT Treatment/Interventions ADLs/Self Care Home Management;Cryotherapy;Electrical Stimulation;Iontophoresis 31m/ml Dexamethasone;Moist Heat;Ultrasound;Therapeutic activities;Therapeutic exercise;Neuromuscular re-education;Patient/family education;Manual techniques;Scar mobilization;Passive range of motion;Dry needling;Taping;Joint Manipulations    PT Next Visit Plan progress ROM and exercises per protocol.  Post op week 11 as of 08/01/21.    PT Home Exercise Plan Access Code: [email or web address]   Consulted and Agree with Plan of Care Patient             Patient will benefit from skilled therapeutic intervention in order to improve the following deficits and impairments:  Decreased skin integrity, Increased muscle spasms, Decreased range of motion, Decreased scar mobility, Decreased activity tolerance, Decreased strength, Increased fascial restricitons, Impaired flexibility, Impaired UE functional use, Postural dysfunction, Pain  Visit Diagnosis: Acute pain of right shoulder  Stiffness of right shoulder, not elsewhere classified  Other muscle spasm  Muscle weakness (generalized)     Problem List Patient Active Problem List   Diagnosis Date Noted   Tendinopathy of rotator cuff, left 04/04/2021   Full thickness rotator cuff tear 03/14/2021    BArtist Pais PTA 07/29/2021, 2:30 PM  [facility] [place] [address]Phone: [phone]  Fax:  [phone] Name: MOtoniel MyhandMRN: [id number] of Birth: 302/03/1971

## 2021-08-01 ENCOUNTER — Other Ambulatory Visit: Payer: Self-pay

## 2021-08-01 ENCOUNTER — Ambulatory Visit: Payer: Medicaid Other

## 2021-08-01 DIAGNOSIS — M62838 Other muscle spasm: Secondary | ICD-10-CM

## 2021-08-01 DIAGNOSIS — M25511 Pain in right shoulder: Secondary | ICD-10-CM

## 2021-08-01 DIAGNOSIS — M6281 Muscle weakness (generalized): Secondary | ICD-10-CM

## 2021-08-01 DIAGNOSIS — M25611 Stiffness of right shoulder, not elsewhere classified: Secondary | ICD-10-CM

## 2021-08-01 NOTE — Therapy (Signed)
[facility] [address]  [place], [place], [address] Phone: [phone]   Fax:  [phone]  Physical Therapy Treatment  Patient Details  Name: Nathaniel Solis MRN: [id number] Date of Birth: Apr 08, 1971 Referring Provider (PT): Tania Ade MD   Encounter Date: 08/01/2021   PT End of Session - 08/01/21 1533     Visit Number 13    Number of Visits 16    Date for PT Re-Evaluation 08/08/21    Authorization Type [employer] M[employer]    Authorization Time Period 06/13/21-08/08/21    Authorization - Visit Number 16    PT Start Time 1447    PT Stop Time 1529    PT Time Calculation (min) 42 min    Activity Tolerance Patient tolerated treatment well    Behavior During Therapy [facility] for tasks assessed/performed             Past Medical History:  Diagnosis Date   Kidney stone     Past Surgical History:  Procedure Laterality Date   APPENDECTOMY      There were no vitals filed for this visit.   Subjective Assessment - 08/01/21 1452     Subjective Sore after last session, felt like I worked out in a gym.    Currently in Pain? No/denies   muscle soreness               [facility] PT Assessment - 08/01/21 0001       Strength   Overall Strength Comments R shld flexion and IR: 4/5, ER and ABD: 4-/5                           [facility] Adult PT Treatment/Exercise - 08/01/21 0001       Shoulder Exercises: Sidelying   External Rotation Strengthening;Right;Weights;20 reps    External Rotation Weight (lbs) 2    ABduction Strengthening;Right;Weights;20 reps    ABduction Weight (lbs) 1      Shoulder Exercises: Standing   External Rotation Strengthening;Right;Theraband;20 reps    Theraband Level (Shoulder External Rotation) Level 1 (Yellow)    External Rotation Limitations concentric    Internal Rotation Strengthening;Right;20 reps;Theraband    Theraband Level (Shoulder Internal Rotation) Level 1  (Yellow)    Flexion Strengthening;Right;Theraband;15 reps    Theraband Level (Shoulder Flexion) Level 1 (Yellow)    ABduction Strengthening;Right;10 reps;Theraband    Theraband Level (Shoulder ABduction) Level 1 (Yellow)    Extension Strengthening;Both;Theraband;10 reps    Theraband Level (Shoulder Extension) Level 3 (Green)    Row Strengthening;Both;Theraband;10 reps    Theraband Level (Shoulder Row) Level 3 (Green)      Shoulder Exercises: Pulleys   Flexion 3 minutes    ABduction 3 minutes      Manual Therapy   Manual Therapy Soft tissue mobilization    Soft tissue mobilization STM to R biceps, anterior deltoid                       PT Short Term Goals - 06/27/21 1400       PT SHORT TERM GOAL #1   Title Ind with initial HEP    Time 2    Period Weeks    Status Achieved   06/27/21   Target Date 06/27/21               PT Long Term Goals - 07/18/21 1542  PT LONG TERM GOAL #1   Title Pt. will be independent with progressed HEP for RUE strengthening to improve outcomes.    Status On-going   07/18/21- met for current.   Target Date 08/08/21      PT LONG TERM GOAL #2   Title Pt. will demonstrate full R shoulder ROM without pain in order to perform overhead job activities.    Status On-going   07/18/21- progressing, see flowsheet.  Pain with eccentric lowering   Target Date 08/08/21      PT LONG TERM GOAL #3   Title Pt. will demonstrate 5/5 R shoulder strength without pain in order to start lifting weights.    Status On-going   08/01/21- see flowsheet   Target Date 08/08/21      PT LONG TERM GOAL #4   Title Pt. will be able to lift 40lbs without increased R shoulder pain to return to work.    Status On-going   07/18/21- not yet appropriate to test.   Target Date 08/08/21                   Plan - 08/01/21 1534     Clinical Impression Statement Pt reported some soreness from the massage last session. Continued progressing strengthening, he is  now 11 week post op. He still has catching when raising his shoulder into fwd flexion but it has improved some with STM. We were able to progress flex and ABD with resistance today, no increased pain. Strength test reveal improvement but still some limitations with ABD and ER strength.   Personal Factors and Comorbidities Comorbidity 3+    Comorbidities history L RTC repair, COPD, smoker    PT Frequency 2x / week    PT Duration 8 weeks    PT Treatment/Interventions ADLs/Self Care Home Management;Cryotherapy;Electrical Stimulation;Iontophoresis 52m/ml Dexamethasone;Moist Heat;Ultrasound;Therapeutic activities;Therapeutic exercise;Neuromuscular re-education;Patient/family education;Manual techniques;Scar mobilization;Passive range of motion;Dry needling;Taping;Joint Manipulations    PT Next Visit Plan progress ROM and exercises per protocol.  Post op week 11 as of 08/01/21.    PT Home Exercise Plan Access Code: [email or web address]   Consulted and Agree with Plan of Care Patient             Patient will benefit from skilled therapeutic intervention in order to improve the following deficits and impairments:  Decreased skin integrity, Increased muscle spasms, Decreased range of motion, Decreased scar mobility, Decreased activity tolerance, Decreased strength, Increased fascial restricitons, Impaired flexibility, Impaired UE functional use, Postural dysfunction, Pain  Visit Diagnosis: Acute pain of right shoulder  Stiffness of right shoulder, not elsewhere classified  Other muscle spasm  Muscle weakness (generalized)     Problem List Patient Active Problem List   Diagnosis Date Noted   Tendinopathy of rotator cuff, left 04/04/2021   Full thickness rotator cuff tear 03/14/2021    BArtist Pais PTA 08/01/2021, 3:38 PM  [facility] [place] [address]Phone: [phone]  Fax:  [phone] Name: MJakwan SallyMRN: [id number] of Birth: 305-09-1970

## 2021-08-05 ENCOUNTER — Ambulatory Visit: Payer: Medicaid Other

## 2021-08-13 ENCOUNTER — Encounter: Payer: Medicaid Other | Admitting: Physical Therapy

## 2021-08-14 ENCOUNTER — Ambulatory Visit: Payer: Medicaid Other | Attending: Orthopedic Surgery

## 2021-08-14 ENCOUNTER — Other Ambulatory Visit: Payer: Self-pay

## 2021-08-14 DIAGNOSIS — M62838 Other muscle spasm: Secondary | ICD-10-CM | POA: Diagnosis present

## 2021-08-14 DIAGNOSIS — M6281 Muscle weakness (generalized): Secondary | ICD-10-CM | POA: Diagnosis present

## 2021-08-14 DIAGNOSIS — M25611 Stiffness of right shoulder, not elsewhere classified: Secondary | ICD-10-CM | POA: Insufficient documentation

## 2021-08-14 DIAGNOSIS — M25511 Pain in right shoulder: Secondary | ICD-10-CM | POA: Diagnosis not present

## 2021-08-14 NOTE — Patient Instructions (Signed)
Access Code: [email or web address] ?URL: [email or web address] ?Date: 08/14/2021 ?Prepared by: Verta Ellen ? ?Exercises ?Sidelying Shoulder ER with Towel and Dumbbell - 1 x daily - 3 x weekly - 3 sets - 10 reps ?Standing Wall [employer] with [employer] - 1 x daily - 3 x weekly - 3 sets - 10 reps ?Standing Shoulder Row with Anchored Resistance - 1 x daily - 3 x weekly - 3 sets - 10 reps ?Shoulder extension with resistance - Neutral - 1 x daily - 3 x weekly - 3 sets - 10 reps ?Shoulder External Rotation with Anchored Resistance - 1 x daily - 3 x weekly - 3 sets - 10 reps ?Standing Shoulder Flexion to 90 Degrees with Dumbbells - 1 x daily - 3 x weekly - 3 sets - 10 reps ?Scaption with Dumbbells - 1 x daily - 3 x weekly - 3 sets - 10 reps ? ?

## 2021-08-14 NOTE — Therapy (Addendum)
[facility] ?[address] ?[place], [place], [address] ?Phone: [phone]   Fax:  [phone] ? ?Physical Therapy Treatment ?Progress Note ?Reporting Period 07/18/2021 to 08/14/2021 ? ?See note below for Objective Data and Assessment of Progress/Goals.  ? ? ? ?Patient Details  ?Name: Nathaniel Solis ?MRN: [id number] ?Date of Birth: 06/10/1971 ?Referring Provider (PT): Tania Ade MD ? ? ?Encounter Date: 08/14/2021 ? ? PT End of Session - 08/14/21 1613   ? ? Visit Number 14   ? Number of Visits 26   ? Date for PT Re-Evaluation 09/25/21   ? Authorization Type [employer] MCD   ? PT Start Time 2694   pt late  ? PT Stop Time 8546   ? PT Time Calculation (min) 36 min   ? Activity Tolerance Patient tolerated treatment well   ? Behavior During Therapy [facility] for tasks assessed/performed   ? ?  ?  ? ?  ? ? ?Past Medical History:  ?Diagnosis Date  ? Kidney stone   ? ? ?Past Surgical History:  ?Procedure Laterality Date  ? APPENDECTOMY    ? ? ?There were no vitals filed for this visit. ? ? Subjective Assessment - 08/14/21 1538   ? ? Subjective "Been sore for the past few days with my grandson."   ? Currently in Pain? No/denies   ? ?  ?  ? ?  ? ? ? ? ? [facility] PT Assessment - 08/14/21 0001   ? ?  ? Assessment  ? Medical Diagnosis Z98.890 (ICD-10-CM) - S/P rotator cuff repair   ? Referring Provider (PT) Tania Ade MD   ? Onset Date/Surgical Date 05/16/21   ?  ? Prior Function  ? Level of Independence Independent   ?  ? Observation/Other Assessments  ? Focus on Therapeutic Outcomes (FOTO)  shoulder 50% - 64% predicted after 21 visits   ?  ? AROM  ? Right Shoulder Flexion 160 Degrees   ? Right Shoulder ABduction 165 Degrees   ?  ? Strength  ? Strength Assessment Site Shoulder   ? Right/Left Shoulder Right   ? Right Shoulder Flexion 3+/5   ? Right Shoulder ABduction 4-/5   ? Right Shoulder Internal Rotation 4+/5   ? Right Shoulder External Rotation 4-/5   ? ?  ?  ? ?   ? ? ? ? ? ? ? ? ? ? ? ? ? ? ? ? [facility] Adult PT Treatment/Exercise - 08/14/21 0001   ? ?  ? Shoulder Exercises: Standing  ? External Rotation Strengthening;Right;10 reps;Theraband   ? Theraband Level (Shoulder External Rotation) Level 2 (Red)   ? Extension Strengthening;Both;Theraband;10 reps   ? Theraband Level (Shoulder Extension) Level 3 (Green)   ? Row Strengthening;Both;Theraband;10 reps   ? Theraband Level (Shoulder Row) Level 3 (Green)   ?  ? Shoulder Exercises: Pulleys  ? Flexion 3 minutes   ? ABduction 3 minutes   ? ?  ?  ? ?  ? ? ? ? ? ? ? ? ? ? PT Education - 08/14/21 1613   ? ? Education Details HEP update   ? Person(s) Educated Patient   ? Methods Explanation;Demonstration;Handout   ? Comprehension Verbalized understanding;Returned demonstration   ? ?  ?  ? ?  ? ? ? PT Short Term Goals - 06/27/21 1400   ? ?  ? PT SHORT TERM GOAL #1  ? Title Ind with initial HEP   ? Time 2   ?  Period Weeks   ? Status Achieved   06/27/21  ? Target Date 06/27/21   ? ?  ?  ? ?  ? ? ? ? PT Long Term Goals - 08/14/21 1546   ? ?  ? PT LONG TERM GOAL #1  ? Title Pt. will be independent with progressed HEP for RUE strengthening to improve outcomes.   ? Status Partially Met   08/14/21- met for current.  ? Target Date 09/25/21   ?  ? PT LONG TERM GOAL #2  ? Title Pt. will demonstrate full R shoulder ROM without pain in order to perform overhead job activities.   ? Status Partially Met   08/14/21- pain with [place] job activites  ? Target Date 09/25/21   ?  ? PT LONG TERM GOAL #3  ? Title Pt. will demonstrate 5/5 R shoulder strength without pain in order to start lifting weights.   ? Status On-going   08/14/21- still limited overall in R shoulder strength  ? Target Date 09/25/21   ?  ? PT LONG TERM GOAL #4  ? Title Pt. will be able to lift 40lbs without increased R shoulder pain to return to work.   ? Status On-going   07/18/21- not yet appropriate to test.  ? Target Date 09/25/21   ? ?  ?  ? ?  ? ? ? ? ? ? ? ? Plan - 08/14/21 1614   ? ? Clinical  Impression Statement Pt presents today with overall improvement in shoulder ROM but limited in shoulder strength globally. He notes difficulty with functional movements such as reaching into the top shelf of the fridge for items and working on cars w/o being quickly fatigued in the R shoulder. He noted that trying to place items on shelf OH gives him pain. FOTO score improved over 20 points from last time taken indicating fucntional improvement. He had pain with resistance given with flexion and ABD. I changed the HEP to mostly standing because he felt that he would be more compliant with these type of exercises.  From now on he would benefit from strength focused PT now that his protocol allows for it. As of now his ROM goal is partially met, other goals are ongoing.  Artist Pais, PTA ? ? Pt would continue to benefit from continued PT for an additional 2x a week for 6 weeks to restore R shoulder strength to allow for better performance of work and house related activities.  Rennie Natter, PT ?  ? Personal Factors and Comorbidities Comorbidity 3+   ? Comorbidities history L RTC repair, COPD, smoker   ? PT Frequency 2x / week   ? PT Duration 6 weeks   ? PT Treatment/Interventions ADLs/Self Care Home Management;Cryotherapy;Electrical Stimulation;Iontophoresis 32m/ml Dexamethasone;Moist Heat;Ultrasound;Therapeutic activities;Therapeutic exercise;Neuromuscular re-education;Patient/family education;Manual techniques;Scar mobilization;Passive range of motion;Dry needling;Taping;Joint Manipulations   ? PT Next Visit Plan progress ROM and exercises per protocol.  Post op week 13 as of 08/15/21.   ? PT Home Exercise Plan Access Code: [email or web address]  ? Consulted and Agree with Plan of Care Patient   ? ?  ?  ? ?  ? ? ?Patient will benefit from skilled therapeutic intervention in order to improve the following deficits and impairments:  Decreased skin integrity, Increased muscle spasms, Decreased range of motion, Decreased  scar mobility, Decreased activity tolerance, Decreased strength, Increased fascial restricitons, Impaired flexibility, Impaired UE functional use, Postural dysfunction, Pain ? ?Visit Diagnosis: ?Acute pain of right shoulder ? ?  Stiffness of right shoulder, not elsewhere classified ? ?Other muscle spasm ? ?Muscle weakness (generalized) ? ? ? ? ?Problem List ?Patient Active Problem List  ? Diagnosis Date Noted  ? Tendinopathy of rotator cuff, left 04/04/2021  ? Full thickness rotator cuff tear 03/14/2021  ? ? ?Artist Pais, PTA ?08/14/2021, 4:32 PM ? ?I have reviewed current progress and treatment plan, which is appropriate.  Pt. Would benefit from continued skilled therapy. ? ?Rennie Natter, PT, DPT 08/15/21  ? ?[facility] [place] ?[facility] ?[address] ?[place], [place], [address] ?Phone: [phone]   Fax:  [phone] ? ?Name: Langford Carias ?MRN: [id number] ?Date of Birth: [birth year] ? ? ? ?

## 2021-08-15 ENCOUNTER — Encounter: Payer: Self-pay | Admitting: Physical Therapy

## 2021-08-15 ENCOUNTER — Ambulatory Visit: Payer: Medicaid Other | Admitting: Physical Therapy

## 2021-08-15 DIAGNOSIS — M62838 Other muscle spasm: Secondary | ICD-10-CM

## 2021-08-15 DIAGNOSIS — M25611 Stiffness of right shoulder, not elsewhere classified: Secondary | ICD-10-CM

## 2021-08-15 DIAGNOSIS — M6281 Muscle weakness (generalized): Secondary | ICD-10-CM

## 2021-08-15 DIAGNOSIS — M25511 Pain in right shoulder: Secondary | ICD-10-CM

## 2021-08-15 NOTE — Therapy (Signed)
[place] ?[facility] ?[address] ?[place], [place], [address] ?Phone: [phone]   Fax:  [phone] ? ?Physical Therapy Treatment ? ?Patient Details  ?Name: Nathaniel Solis ?MRN: [id number] ?Date of Birth: [birth year] ?Referring Provider (PT): Nathaniel Ade MD ? ? ?Encounter Date: 08/15/2021 ? ? PT End of Session - 08/15/21 1303   ? ? Visit Number 15   ? Number of Visits 26   ? Date for PT Re-Evaluation 09/25/21   ? Authorization Type [employer] MCD   ? PT Start Time 1304   ? PT Stop Time 3474   ? PT Time Calculation (min) 39 min   ? Activity Tolerance Patient tolerated treatment well   ? Behavior During Therapy [facility] for tasks assessed/performed   ? ?  ?  ? ?  ? ? ?Past Medical History:  ?Diagnosis Date  ? Kidney stone   ? ? ?Past Surgical History:  ?Procedure Laterality Date  ? APPENDECTOMY    ? ? ?There were no vitals filed for this visit. ? ? Subjective Assessment - 08/15/21 1306   ? ? Subjective No new complaints since yesterday.   ? Currently in Pain? Yes   ? Pain Score 2    ? Pain Location Shoulder   ? Pain Orientation Right   ? Pain Descriptors / Indicators Sore   ? ?  ?  ? ?  ? ? ? ? ? ? ? ? ? ? ? ? ? ? ? ? ? ? ? ? [facility] Adult PT Treatment/Exercise - 08/15/21 0001   ? ?  ? Shoulder Exercises: Sidelying  ? External Rotation Strengthening;Right;Weights;20 reps   ? External Rotation Weight (lbs) 3   ? External Rotation Limitations 1x10, 1x8   ? ABduction Strengthening;Left;Weights;20 reps   ? ABduction Weight (lbs) 1   ?  ? Shoulder Exercises: Standing  ? Protraction Both;10 reps   ? Theraband Level (Shoulder Protraction) Level 3 (Green)   ? Horizontal ABduction Both;10 reps   ? Theraband Level (Shoulder Horizontal ABduction) Level 1 (Yellow)   ? External Rotation Strengthening;Right;10 reps;Theraband   ? Theraband Level (Shoulder External Rotation) Level 2 (Red)   ? Internal Rotation Strengthening;Right;Theraband;10 reps   ? Theraband Level (Shoulder  Internal Rotation) Level 3 (Green)   ? Diagonals Right;10 reps   ? Theraband Level (Shoulder Diagonals) Level 1 (Yellow)   ? Diagonals Limitations D2 flex; D1 flex with 2# wt (YTB too difficult)   ? Other Standing Exercises 2nd shelf taps 1# wt x 10 ea flex and scaption   ? Other Standing Exercises standing goal post ER x 10 no wt   ?  ? Shoulder Exercises: Pulleys  ? Flexion 3 minutes   ? ABduction 3 minutes   ? ?  ?  ? ?  ? ? ? ? ? ? ? ? ? ? ? ? PT Short Term Goals - 06/27/21 1400   ? ?  ? PT SHORT TERM GOAL #1  ? Title Ind with initial HEP   ? Time 2   ? Period Weeks   ? Status Achieved   06/27/21  ? Target Date 06/27/21   ? ?  ?  ? ?  ? ? ? ? PT Long Term Goals - 08/14/21 1546   ? ?  ? PT LONG TERM GOAL #1  ? Title Pt. will be independent with progressed HEP for RUE strengthening to improve outcomes.   ? Status Partially Met   08/14/21-  met for current.  ? Target Date 09/25/21   ?  ? PT LONG TERM GOAL #2  ? Title Pt. will demonstrate full R shoulder ROM without pain in order to perform overhead job activities.   ? Status Partially Met   08/14/21- pain with [place] job activites  ? Target Date 09/25/21   ?  ? PT LONG TERM GOAL #3  ? Title Pt. will demonstrate 5/5 R shoulder strength without pain in order to start lifting weights.   ? Status On-going   08/14/21- still limited overall in R shoulder strength  ? Target Date 09/25/21   ?  ? PT LONG TERM GOAL #4  ? Title Pt. will be able to lift 40lbs without increased R shoulder pain to return to work.   ? Status On-going   07/18/21- not yet appropriate to test.  ? Target Date 09/25/21   ? ?  ?  ? ?  ? ? ? ? ? ? ? ? Plan - 08/15/21 1444   ? ? Clinical Impression Statement Good tolerance to TE today. Nathaniel Solis has less pain with strengthening when he slows down. Added some different therex today since pt was here yesterday.   ? PT Treatment/Interventions ADLs/Self Care Home Management;Cryotherapy;Electrical Stimulation;Iontophoresis 75m/ml Dexamethasone;Moist Heat;Ultrasound;Therapeutic  activities;Therapeutic exercise;Neuromuscular re-education;Patient/family education;Manual techniques;Scar mobilization;Passive range of motion;Dry needling;Taping;Joint Manipulations   ? PT Next Visit Plan progress ROM and exercises per protocol.  Post op week 13 as of 08/15/21.   ? ?  ?  ? ?  ? ? ?Patient will benefit from skilled therapeutic intervention in order to improve the following deficits and impairments:  Decreased skin integrity, Increased muscle spasms, Decreased range of motion, Decreased scar mobility, Decreased activity tolerance, Decreased strength, Increased fascial restricitons, Impaired flexibility, Impaired UE functional use, Postural dysfunction, Pain ? ?Visit Diagnosis: ?Acute pain of right shoulder ? ?Stiffness of right shoulder, not elsewhere classified ? ?Other muscle spasm ? ?Muscle weakness (generalized) ? ? ? ? ?Problem List ?Patient Active Problem List  ? Diagnosis Date Noted  ? Tendinopathy of rotator cuff, left 04/04/2021  ? Full thickness rotator cuff tear 03/14/2021  ? ? ?[name]  PT ?08/15/2021, 2:45 PM ? ?[place] ?[facility] ?[address]?[place] [address]?Phone: [phone]  Fax:  [phone]? ?Name: MKham Solis?MRN: [id number]?Date of Birth: 306-14-1972? ? ? ?

## 2021-08-15 NOTE — Addendum Note (Signed)
Addended by: Jena Gauss on: 08/15/2021 09:23 AM ? ? Modules accepted: Orders ? ?

## 2021-08-19 ENCOUNTER — Other Ambulatory Visit: Payer: Self-pay

## 2021-08-19 ENCOUNTER — Ambulatory Visit: Payer: Medicaid Other

## 2021-08-19 DIAGNOSIS — M6281 Muscle weakness (generalized): Secondary | ICD-10-CM

## 2021-08-19 DIAGNOSIS — M25611 Stiffness of right shoulder, not elsewhere classified: Secondary | ICD-10-CM

## 2021-08-19 DIAGNOSIS — M62838 Other muscle spasm: Secondary | ICD-10-CM

## 2021-08-19 DIAGNOSIS — M25511 Pain in right shoulder: Secondary | ICD-10-CM | POA: Diagnosis not present

## 2021-08-19 NOTE — Therapy (Signed)
[place] ?[facility] ?[address] ?[place], [place], [address] ?Phone: [phone]   Fax:  [phone] ? ?Physical Therapy Treatment ? ?Patient Details  ?Name: Nathaniel Solis ?MRN: [id number] ?Date of Birth: [birth year] ?Referring Provider (PT): Tania Ade MD ? ? ?Encounter Date: 08/19/2021 ? ? PT End of Session - 08/19/21 1443   ? ? Visit Number 16   ? Number of Visits 26   ? Date for PT Re-Evaluation 09/25/21   ? Authorization Type [employer] MCD   ? PT Start Time 1402   ? PT Stop Time 1440   ? PT Time Calculation (min) 38 min   ? Activity Tolerance Patient tolerated treatment well   ? Behavior During Therapy [facility] for tasks assessed/performed   ? ?  ?  ? ?  ? ? ?Past Medical History:  ?Diagnosis Date  ? Kidney stone   ? ? ?Past Surgical History:  ?Procedure Laterality Date  ? APPENDECTOMY    ? ? ?There were no vitals filed for this visit. ? ? Subjective Assessment - 08/19/21 1405   ? ? Subjective The first couple of days of strengthening exercises were rough but now able to lower arm w/o catching.   ? Currently in Pain? Yes   ? Pain Score 2    ? Pain Location Shoulder   ? Pain Orientation Right   ? Pain Descriptors / Indicators Sore   ? Pain Type Surgical pain   ? ?  ?  ? ?  ? ? ? ? ? ? ? ? ? ? ? ? ? ? ? ? ? ? ? ? [place] Adult PT Treatment/Exercise - 08/19/21 0001   ? ?  ? Shoulder Exercises: Prone  ? External Rotation Strengthening;Right;10 reps;Weights   ? External Rotation Weight (lbs) 2   ? Other Prone Exercises cobra push ups x 10   ?  ? Shoulder Exercises: Sidelying  ? ABduction Strengthening;Right;Weights;15 reps   ? ABduction Weight (lbs) 2   ? ABduction Limitations arm in 90 deg flexion (lateral raise)   ?  ? Shoulder Exercises: Standing  ? Horizontal ABduction Both;10 reps   ? Theraband Level (Shoulder Horizontal ABduction) Level 2 (Red)   ? External Rotation Strengthening;Right;Theraband;20 reps   ? Theraband Level (Shoulder External Rotation) Level  2 (Red)   ? Extension Strengthening;Both;Theraband;10 reps   ? Theraband Level (Shoulder Extension) Level 4 (Blue)   ? Row Strengthening;Both;15 reps;Theraband   ? Theraband Level (Shoulder Row) Level 4 (Blue)   ? Diagonals Right;10 reps   ? Theraband Level (Shoulder Diagonals) Level 2 (Red)   ? Diagonals Limitations D2 flex   ? Other Standing Exercises 2nd shelf taps 2# wt 2 x 10 each flex and scaption   ?  ? Shoulder Exercises: Pulleys  ? Flexion 3 minutes   ? ABduction 3 minutes   ? ?  ?  ? ?  ? ? ? ? ? ? ? ? ? ? ? ? PT Short Term Goals - 06/27/21 1400   ? ?  ? PT SHORT TERM GOAL #1  ? Title Ind with initial HEP   ? Time 2   ? Period Weeks   ? Status Achieved   06/27/21  ? Target Date 06/27/21   ? ?  ?  ? ?  ? ? ? ? PT Long Term Goals - 08/14/21 1546   ? ?  ? PT LONG TERM GOAL #1  ? Title Pt. will be  independent with progressed HEP for RUE strengthening to improve outcomes.   ? Status Partially Met   08/14/21- met for current.  ? Target Date 09/25/21   ?  ? PT LONG TERM GOAL #2  ? Title Pt. will demonstrate full R shoulder ROM without pain in order to perform overhead job activities.   ? Status Partially Met   08/14/21- pain with [place] job activites  ? Target Date 09/25/21   ?  ? PT LONG TERM GOAL #3  ? Title Pt. will demonstrate 5/5 R shoulder strength without pain in order to start lifting weights.   ? Status On-going   08/14/21- still limited overall in R shoulder strength  ? Target Date 09/25/21   ?  ? PT LONG TERM GOAL #4  ? Title Pt. will be able to lift 40lbs without increased R shoulder pain to return to work.   ? Status On-going   07/18/21- not yet appropriate to test.  ? Target Date 09/25/21   ? ?  ?  ? ?  ? ? ? ? ? ? ? ? Plan - 08/19/21 1443   ? ? Clinical Impression Statement Pt responded well to the treatment today. We were able to keep progressing strength focused exercises to improve confidence with his ADLs. He notes decreased episodes of catching with eecentric lowering from the flexion or abduction plane.  Continue progressing strengthening exercises.   ? Personal Factors and Comorbidities Comorbidity 3+   ? Comorbidities history L RTC repair, COPD, smoker   ? PT Frequency 2x / week   ? PT Duration 6 weeks   ? PT Treatment/Interventions ADLs/Self Care Home Management;Cryotherapy;Electrical Stimulation;Iontophoresis 54m/ml Dexamethasone;Moist Heat;Ultrasound;Therapeutic activities;Therapeutic exercise;Neuromuscular re-education;Patient/family education;Manual techniques;Scar mobilization;Passive range of motion;Dry needling;Taping;Joint Manipulations   ? PT Next Visit Plan progress ROM and exercises per protocol.  Post op week 13 as of 08/15/21.   ? PT Home Exercise Plan Access Code: [email or web address]  ? Consulted and Agree with Plan of Care Patient   ? ?  ?  ? ?  ? ? ?Patient will benefit from skilled therapeutic intervention in order to improve the following deficits and impairments:  Decreased skin integrity, Increased muscle spasms, Decreased range of motion, Decreased scar mobility, Decreased activity tolerance, Decreased strength, Increased fascial restricitons, Impaired flexibility, Impaired UE functional use, Postural dysfunction, Pain ? ?Visit Diagnosis: ?Acute pain of right shoulder ? ?Stiffness of right shoulder, not elsewhere classified ? ?Other muscle spasm ? ?Muscle weakness (generalized) ? ? ? ? ?Problem List ?Patient Active Problem List  ? Diagnosis Date Noted  ? Tendinopathy of rotator cuff, left 04/04/2021  ? Full thickness rotator cuff tear 03/14/2021  ? ? ?BArtist Pais PTA ?08/19/2021, 2:49 PM ? ?[place] ?[facility] ?[address]?[place] [address]?Phone: [phone]  Fax:  [phone]? ?Name: MKeyan Folson?MRN: [id number]?Date of Birth: 3Nov 25, 1972? ? ? ?

## 2021-08-22 ENCOUNTER — Encounter: Payer: Self-pay | Admitting: Physical Therapy

## 2021-08-22 ENCOUNTER — Other Ambulatory Visit: Payer: Self-pay

## 2021-08-22 ENCOUNTER — Ambulatory Visit: Payer: Medicaid Other | Admitting: Physical Therapy

## 2021-08-22 DIAGNOSIS — M25511 Pain in right shoulder: Secondary | ICD-10-CM

## 2021-08-22 DIAGNOSIS — M25611 Stiffness of right shoulder, not elsewhere classified: Secondary | ICD-10-CM

## 2021-08-22 DIAGNOSIS — M6281 Muscle weakness (generalized): Secondary | ICD-10-CM

## 2021-08-22 DIAGNOSIS — M62838 Other muscle spasm: Secondary | ICD-10-CM

## 2021-08-22 NOTE — Therapy (Signed)
[place] ?[facility] ?[address] ?[place], [place], [address] ?Phone: [phone]   Fax:  [phone] ? ?Physical Therapy Treatment ? ?Patient Details  ?Name: Nathaniel Solis ?MRN: [id number] ?Date of Birth: 11-22-1970 ?Referring Provider (PT): Tania Ade MD ? ? ?Encounter Date: 08/22/2021 ? ? PT End of Session - 08/22/21 1619   ? ? Visit Number 17   ? Number of Visits 26   ? Date for PT Re-Evaluation 09/25/21   ? Authorization Type [employer] MCD   ? PT Start Time 1619   ? PT Stop Time 1700   ? PT Time Calculation (min) 41 min   ? Activity Tolerance Patient tolerated treatment well   ? Behavior During Therapy [facility] for tasks assessed/performed   ? ?  ?  ? ?  ? ? ?Past Medical History:  ?Diagnosis Date  ? Kidney stone   ? ? ?Past Surgical History:  ?Procedure Laterality Date  ? APPENDECTOMY    ? ? ?There were no vitals filed for this visit. ? ? Subjective Assessment - 08/22/21 1621   ? ? Subjective Pt. reports he's doing well, no new concerns   ? Currently in Pain? Yes   ? Pain Score 2    ? Pain Location Shoulder   ? Pain Orientation Right   ? Pain Descriptors / Indicators Sore   ? ?  ?  ? ?  ? ? ? ? ? [facility] PT Assessment - 08/22/21 0001   ? ?  ? AROM  ? Right Shoulder Flexion 160 Degrees   ? Right Shoulder ABduction 168 Degrees   ? ?  ?  ? ?  ? ? ? ? ? ? ? ? ? ? ? ? ? ? ? ? [place] Adult PT Treatment/Exercise - 08/22/21 0001   ? ?  ? Shoulder Exercises: Standing  ? External Rotation Strengthening;Right;Theraband;20 reps   ? Theraband Level (Shoulder External Rotation) Level 3 (Green)   ? External Rotation Limitations towel under arm   ? Row Strengthening;Both;15 reps;Theraband   ? Theraband Level (Shoulder Row) Level 4 (Blue)   ? Diagonals Right;10 reps   ? Theraband Level (Shoulder Diagonals) Level 3 (Green)   ? Diagonals Limitations D2 flex   ? Other Standing Exercises bicep curls 5# 2 x 10, elbow extension GTB 2 x 10   ? Other Standing Exercises 2nd shelf taps  2# wt 2 x 10 each flex and scaption   ?  ? Shoulder Exercises: Pulleys  ? Flexion 3 minutes   ? ABduction 3 minutes   ?  ? Shoulder Exercises: ROM/Strengthening  ? Rhythmic Stabilization, Supine 3 x 1 min, light taps   ?  ? Manual Therapy  ? Manual Therapy Soft tissue mobilization   ? Manual therapy comments to improve ROM and decrease spasm, supine   ? Soft tissue mobilization STM to R UT,  IASTM to R pectoralis   ? Myofascial Release TPR to R UT   ? ?  ?  ? ?  ? ? ? ? ? ? ? ? ? ? ? ? PT Short Term Goals - 06/27/21 1400   ? ?  ? PT SHORT TERM GOAL #1  ? Title Ind with initial HEP   ? Time 2   ? Period Weeks   ? Status Achieved   06/27/21  ? Target Date 06/27/21   ? ?  ?  ? ?  ? ? ? ? PT Long Term Goals -  08/14/21 1546   ? ?  ? PT LONG TERM GOAL #1  ? Title Pt. will be independent with progressed HEP for RUE strengthening to improve outcomes.   ? Status Partially Met   08/14/21- met for current.  ? Target Date 09/25/21   ?  ? PT LONG TERM GOAL #2  ? Title Pt. will demonstrate full R shoulder ROM without pain in order to perform overhead job activities.   ? Status Partially Met   08/14/21- pain with [place] job activites  ? Target Date 09/25/21   ?  ? PT LONG TERM GOAL #3  ? Title Pt. will demonstrate 5/5 R shoulder strength without pain in order to start lifting weights.   ? Status On-going   08/14/21- still limited overall in R shoulder strength  ? Target Date 09/25/21   ?  ? PT LONG TERM GOAL #4  ? Title Pt. will be able to lift 40lbs without increased R shoulder pain to return to work.   ? Status On-going   07/18/21- not yet appropriate to test.  ? Target Date 09/25/21   ? ?  ?  ? ?  ? ? ? ? ? ? ? ? Plan - 08/22/21 1802   ? ? Clinical Impression Statement Pt. demonstrates good pain free R shoulder ROM now, with no pain lowering arm.  He was able to tolerate all exercises well although reported some soreness in shoulder at end of session.  Cues to prevent shoulder hiking.  Reminded not to lift heavy items at home/work with R  shoulder still.  Manual therapy at end of session to R UT as still reports pain here.  Would benefit from continued skilled therapy.   ? Personal Factors and Comorbidities Comorbidity 3+   ? Comorbidities history L RTC repair, COPD, smoker   ? PT Frequency 2x / week   ? PT Duration 6 weeks   ? PT Treatment/Interventions ADLs/Self Care Home Management;Cryotherapy;Electrical Stimulation;Iontophoresis 16m/ml Dexamethasone;Moist Heat;Ultrasound;Therapeutic activities;Therapeutic exercise;Neuromuscular re-education;Patient/family education;Manual techniques;Scar mobilization;Passive range of motion;Dry needling;Taping;Joint Manipulations   ? PT Next Visit Plan progress ROM and exercises per protocol.  Post op week 13 as of 08/15/21.   ? PT Home Exercise Plan Access Code: [email or web address]  ? Consulted and Agree with Plan of Care Patient   ? ?  ?  ? ?  ? ? ?Patient will benefit from skilled therapeutic intervention in order to improve the following deficits and impairments:  Decreased skin integrity, Increased muscle spasms, Decreased range of motion, Decreased scar mobility, Decreased activity tolerance, Decreased strength, Increased fascial restricitons, Impaired flexibility, Impaired UE functional use, Postural dysfunction, Pain ? ?Visit Diagnosis: ?Acute pain of right shoulder ? ?Stiffness of right shoulder, not elsewhere classified ? ?Other muscle spasm ? ?Muscle weakness (generalized) ? ? ? ? ?Problem List ?Patient Active Problem List  ? Diagnosis Date Noted  ? Tendinopathy of rotator cuff, left 04/04/2021  ? Full thickness rotator cuff tear 03/14/2021  ? ? ?ERennie Natter PT, DPT  ?08/22/2021, 6:06 PM ? ?[place] ?[facility] ?[address] [address]?Phone: [phone]  Fax:  [phone]? ?Name: MGeorgie Haque?MRN: [id number]?Date of Birth: 311/21/1972? ? ? ?

## 2021-08-26 ENCOUNTER — Encounter: Payer: Self-pay | Admitting: Physical Therapy

## 2021-08-26 ENCOUNTER — Other Ambulatory Visit: Payer: Self-pay

## 2021-08-26 ENCOUNTER — Ambulatory Visit: Payer: Medicaid Other | Admitting: Physical Therapy

## 2021-08-26 DIAGNOSIS — M62838 Other muscle spasm: Secondary | ICD-10-CM

## 2021-08-26 DIAGNOSIS — M25511 Pain in right shoulder: Secondary | ICD-10-CM | POA: Diagnosis not present

## 2021-08-26 DIAGNOSIS — M25611 Stiffness of right shoulder, not elsewhere classified: Secondary | ICD-10-CM

## 2021-08-26 DIAGNOSIS — M6281 Muscle weakness (generalized): Secondary | ICD-10-CM

## 2021-08-26 NOTE — Therapy (Signed)
[place] ?[facility] [place] ?[address]  [address] ?[place], [place], [address] ?Phone: [phone]   Fax:  [phone] ? ?Physical Therapy Treatment ? ?Patient Details  ?Name: Nathaniel Solis ?MRN: [id number] ?Date of Birth: 02/01/1971 ?Referring Provider (PT): Chandler, Justin MD ? ? ?Encounter Date: 08/26/2021 ? ? PT End of Session - 08/26/21 1411   ? ? Visit Number 18   ? Number of Visits 26   ? Date for PT Re-Evaluation 09/25/21   ? Authorization Type [employer]   ? Authorization - Visit Number 17   ? PT Start Time 1406   ? PT Stop Time 1450   ? PT Time Calculation (min) 44 min   ? Activity Tolerance Patient tolerated treatment well   ? Behavior During Therapy [facility] for tasks assessed/performed   ? ?  ?  ? ?  ? ? ?Past Medical History:  ?Diagnosis Date  ? Kidney stone   ? ? ?Past Surgical History:  ?Procedure Laterality Date  ? APPENDECTOMY    ? ? ?There were no vitals filed for this visit. ? ? Subjective Assessment - 08/26/21 1411   ? ? Subjective "No pain in shoulder so much as back in neck and shoulder blade"   ? Currently in Pain? Yes   ? Pain Score 2    ? Pain Location Shoulder   ? Pain Orientation Right   ? ?  ?  ? ?  ? ? ? ? ? ? ? ? ? ? ? ? ? ? ? ? ? ? ? ? [facility] Adult PT Treatment/Exercise - 08/26/21 0001   ? ?  ? Shoulder Exercises: Seated  ? Other Seated Exercises I, Y, T's 1# 3 x 8 bil   ?  ? Shoulder Exercises: Prone  ? Retraction Strengthening;Right;20 reps;Weights   ? Retraction Weight (lbs) 3#   ? Flexion Strengthening;Right;20 reps;Weights   ? Flexion Weight (lbs) 3#   ? Extension Strengthening;Right;20 reps;Weights   ? Extension Weight (lbs) 3#   ? Other Prone Exercises cobra push ups  x 15   ?  ? Shoulder Exercises: Standing  ? External Rotation Strengthening;Right;15 reps;Theraband   ? Theraband Level (Shoulder External Rotation) Level 3 (Green)   ? External Rotation Limitations towel under arm   ? Internal Rotation Strengthening;Right;15 reps;Theraband   ?  Theraband Level (Shoulder Internal Rotation) Level 3 (Green)   ? Flexion Strengthening;Right;Theraband;15 reps   ? Theraband Level (Shoulder Flexion) Level 3 (Green)   ? ABduction Strengthening;Right;15 reps;Theraband   ? Theraband Level (Shoulder ABduction) Level 3 (Green)   ? ABduction Limitations to 45 deg   ? Extension Strengthening;Both;Theraband;15 reps   ? Theraband Level (Shoulder Extension) Level 3 (Green)   ? Row Strengthening;Both;15 reps;Theraband   ? Theraband Level (Shoulder Row) Level 4 (Blue)   ?  ? Shoulder Exercises: Pulleys  ? Flexion 3 minutes   ? ABduction 3 minutes   ?  ? Manual Therapy  ? Manual Therapy Soft tissue mobilization;Myofascial release;Other (comment)   ? Manual therapy comments to decrease muscle spasm/pain   ? Soft tissue mobilization STM to R UT,  IASTM to R pectoralis   ? Myofascial Release TPR to R levator at attachment at scapulae   ? Other Manual Therapy skilled palpation and monitoring during dry needling.   ? ?  ?  ? ?  ? ? ? [place] Dry Needling - 08/26/21 0001   ? ? Consent Given? Yes   ? Education   Handout Provided Previously provided   ? Muscles Treated Head and Neck Upper trapezius;Levator scapulae   ? Muscles Treated Upper Quadrant Rhomboids   ? Dry Needling Comments right   ? Upper Trapezius Response Twitch reponse elicited;Palpable increased muscle length   ? Levator Scapulae Response Twitch response elicited   ? Rhomboids Response Twitch response elicited;Palpable increased muscle length   ? ?  ?  ? ?  ? ? ? ? ? ? ? ? ? ? PT Short Term Goals - 06/27/21 1400   ? ?  ? PT SHORT TERM GOAL #1  ? Title Ind with initial HEP   ? Time 2   ? Period Weeks   ? Status Achieved   06/27/21  ? Target Date 06/27/21   ? ?  ?  ? ?  ? ? ? ? PT Long Term Goals - 08/14/21 1546   ? ?  ? PT LONG TERM GOAL #1  ? Title Pt. will be independent with progressed HEP for RUE strengthening to improve outcomes.   ? Status Partially Met   08/14/21- met for current.  ? Target Date 09/25/21   ?  ?  PT LONG TERM GOAL #2  ? Title Pt. will demonstrate full R shoulder ROM without pain in order to perform overhead job activities.   ? Status Partially Met   08/14/21- pain with OH job activites  ? Target Date 09/25/21   ?  ? PT LONG TERM GOAL #3  ? Title Pt. will demonstrate 5/5 R shoulder strength without pain in order to start lifting weights.   ? Status On-going   08/14/21- still limited overall in R shoulder strength  ? Target Date 09/25/21   ?  ? PT LONG TERM GOAL #4  ? Title Pt. will be able to lift 40lbs without increased R shoulder pain to return to work.   ? Status On-going   07/18/21- not yet appropriate to test.  ? Target Date 09/25/21   ? ?  ?  ? ?  ? ? ? ? ? ? ? ? Plan - 08/26/21 1502   ? ? Clinical Impression Statement Pt. reports being aware of posture and trying to not hike R shoulder, but still has pain/spasm in R UT/levator and rhomboids.  Tolerated progression of exercises well although reported muscle fatigue at end of session.  Noted referral pain up through neck with TPR of R levator at scapulae.  He would benefit from continued skilled therapy.   ? Personal Factors and Comorbidities Comorbidity 3+   ? Comorbidities history L RTC repair, COPD, smoker   ? PT Frequency 2x / week   ? PT Duration 6 weeks   ? PT Treatment/Interventions ADLs/Self Care Home Management;Cryotherapy;Electrical Stimulation;Iontophoresis 29m/ml Dexamethasone;Moist Heat;Ultrasound;Therapeutic activities;Therapeutic exercise;Neuromuscular re-education;Patient/family education;Manual techniques;Scar mobilization;Passive range of motion;Dry needling;Taping;Joint Manipulations   ? PT Next Visit Plan progress ROM and exercises per protocol.  Post op week 13 as of 08/15/21.   ? PT Home Exercise Plan Access Code: [email or web address]  ? Consulted and Agree with Plan of Care Patient   ? ?  ?  ? ?  ? ? ?Patient will benefit from skilled therapeutic intervention in order to improve the following deficits and impairments:  Decreased skin integrity,  Increased muscle spasms, Decreased range of motion, Decreased scar mobility, Decreased activity tolerance, Decreased strength, Increased fascial restricitons, Impaired flexibility, Impaired UE functional use, Postural dysfunction, Pain ? ?Visit Diagnosis: ?Acute pain of right shoulder ? ?Stiffness of right shoulder, not  elsewhere classified ? ?Other muscle spasm ? ?Muscle weakness (generalized) ? ? ? ? ?Problem List ?Patient Active Problem List  ? Diagnosis Date Noted  ? Tendinopathy of rotator cuff, left 04/04/2021  ? Full thickness rotator cuff tear 03/14/2021  ? ? ?Elizabeth J Whitman, PT, DPT  ?08/26/2021, 3:04 PM ? ?[place] ?[facility] [place] ?[address]  [address] ?[place], , [address] ?Phone: [phone]   Fax:  [phone] ? ?Name: Nathaniel Solis ?MRN: [id number] ?Date of Birth: 05/14/1971 ? ? ? ?

## 2021-08-29 ENCOUNTER — Ambulatory Visit: Payer: Medicaid Other | Admitting: Physical Therapy

## 2021-09-02 ENCOUNTER — Other Ambulatory Visit: Payer: Self-pay

## 2021-09-02 ENCOUNTER — Ambulatory Visit: Payer: Medicaid Other

## 2021-09-02 DIAGNOSIS — M25511 Pain in right shoulder: Secondary | ICD-10-CM | POA: Diagnosis not present

## 2021-09-02 DIAGNOSIS — M6281 Muscle weakness (generalized): Secondary | ICD-10-CM

## 2021-09-02 DIAGNOSIS — M62838 Other muscle spasm: Secondary | ICD-10-CM

## 2021-09-02 DIAGNOSIS — M25611 Stiffness of right shoulder, not elsewhere classified: Secondary | ICD-10-CM

## 2021-09-02 NOTE — Therapy (Signed)
[place] ?[facility] [place] ?[address] ?[place], [place], [address] ?Phone: [phone]   Fax:  [phone] ? ?Physical Therapy Treatment ? ?Patient Details  ?Name: Nathaniel Solis ?MRN: [id number] ?Date of Birth: 1971/01/31 ?Referring Provider (PT): Tania Ade MD ? ? ?Encounter Date: 09/02/2021 ? ? PT End of Session - 09/02/21 1401   ? ? Visit Number 19   ? Number of Visits 26   ? Date for PT Re-Evaluation 09/25/21   ? Authorization Type [employer]   ? Authorization - Visit Number 17   ? PT Start Time 6301   ? PT Stop Time 6010   ? PT Time Calculation (min) 40 min   ? Activity Tolerance Patient tolerated treatment well   ? Behavior During Therapy [facility] for tasks assessed/performed   ? ?  ?  ? ?  ? ? ?Past Medical History:  ?Diagnosis Date  ? Kidney stone   ? ? ?Past Surgical History:  ?Procedure Laterality Date  ? APPENDECTOMY    ? ? ?There were no vitals filed for this visit. ? ? Subjective Assessment - 09/02/21 1321   ? ? Subjective Pt doing well today, no pain. Notes that the DN helped.   ? Currently in Pain? Yes   ? Pain Score 2    ? Pain Location Shoulder   ? Pain Orientation Right   ? ?  ?  ? ?  ? ? ? ? ? ? ? ? ? ? ? ? ? ? ? ? ? ? ? ? [place] Adult PT Treatment/Exercise - 09/02/21 0001   ? ?  ? Shoulder Exercises: Seated  ? Flexion Strengthening;Both;Theraband   ? Theraband Level (Shoulder Flexion) Level 2 (Red)   ? Flexion Limitations with small progressing to large oscillation movements   ?  ? Shoulder Exercises: Prone  ? Retraction Strengthening;Right;20 reps;Weights   ? Retraction Weight (lbs) 4   ? Flexion Strengthening;Right;20 reps;Weights   ? Flexion Weight (lbs) 4   ? Extension Strengthening;Right;20 reps;Weights   ? Extension Weight (lbs) 4   ? Other Prone Exercises kneeling push up  2x10   ?  ? Shoulder Exercises: Sidelying  ? External Rotation Strengthening;Right;Weights   ? External Rotation Weight (lbs) 4   ? Other Sidelying Exercises lateral  raise with 2lb weight 2x10   ?  ? Shoulder Exercises: Standing  ? External Rotation Strengthening;Theraband;20 reps;Both   ? Theraband Level (Shoulder External Rotation) Level 3 (Green)   ? Diagonals Strengthening;Right;Theraband;15 reps   ? Theraband Level (Shoulder Diagonals) Level 2 (Red)   ? Diagonals Limitations D1/D2 flex, 15 reps each   ? Other Standing Exercises standing ER with small oscillations progressing to large oscillations 15 sec each   ?  ? Shoulder Exercises: Pulleys  ? Flexion 3 minutes   ? ABduction 3 minutes   ?  ? Shoulder Exercises: ROM/Strengthening  ? Other ROM/Strengthening Exercises serratus slide on wall with RTB x 20   ? Other ROM/Strengthening Exercises cabinet reaches with 3lb weight 2x10 flex, abd x 10   ? ?  ?  ? ?  ? ? ? ? ? ? ? ? ? ? ? ? PT Short Term Goals - 06/27/21 1400   ? ?  ? PT SHORT TERM GOAL #1  ? Title Ind with initial HEP   ? Time 2   ? Period Weeks   ? Status Achieved   06/27/21  ? Target Date 06/27/21   ? ?  ?  ? ?  ? ? ? ?  PT Long Term Goals - 08/14/21 1546   ? ?  ? PT LONG TERM GOAL #1  ? Title Pt. will be independent with progressed HEP for RUE strengthening to improve outcomes.   ? Status Partially Met   08/14/21- met for current.  ? Target Date 09/25/21   ?  ? PT LONG TERM GOAL #2  ? Title Pt. will demonstrate full R shoulder ROM without pain in order to perform overhead job activities.   ? Status Partially Met   08/14/21- pain with OH job activites  ? Target Date 09/25/21   ?  ? PT LONG TERM GOAL #3  ? Title Pt. will demonstrate 5/5 R shoulder strength without pain in order to start lifting weights.   ? Status On-going   08/14/21- still limited overall in R shoulder strength  ? Target Date 09/25/21   ?  ? PT LONG TERM GOAL #4  ? Title Pt. will be able to lift 40lbs without increased R shoulder pain to return to work.   ? Status On-going   07/18/21- not yet appropriate to test.  ? Target Date 09/25/21   ? ?  ?  ? ?  ? ? ? ? ? ? ? ? Plan - 09/02/21 1402   ? ? Clinical  Impression Statement Pt tolerated the progression of exercises well. He continues to have difficulty with placing a milk jug on the top shelf of the refrigerator. We were able to progess OH strengthening resistance. Cues given with exercises to ensure proper muscle are targeted. Continue progressing functional strength.   ? Personal Factors and Comorbidities Comorbidity 3+   ? Comorbidities history L RTC repair, COPD, smoker   ? PT Frequency 2x / week   ? PT Duration 6 weeks   ? PT Treatment/Interventions ADLs/Self Care Home Management;Cryotherapy;Electrical Stimulation;Iontophoresis 9m/ml Dexamethasone;Moist Heat;Ultrasound;Therapeutic activities;Therapeutic exercise;Neuromuscular re-education;Patient/family education;Manual techniques;Scar mobilization;Passive range of motion;Dry needling;Taping;Joint Manipulations   ? PT Next Visit Plan progress ROM and exercises per protocol.  Post op week 15 as of 08/29/21.   ? PT Home Exercise Plan Access Code: [email or web address]  ? Consulted and Agree with Plan of Care Patient   ? ?  ?  ? ?  ? ? ?Patient will benefit from skilled therapeutic intervention in order to improve the following deficits and impairments:  Decreased skin integrity, Increased muscle spasms, Decreased range of motion, Decreased scar mobility, Decreased activity tolerance, Decreased strength, Increased fascial restricitons, Impaired flexibility, Impaired UE functional use, Postural dysfunction, Pain ? ?Visit Diagnosis: ?Acute pain of right shoulder ? ?Stiffness of right shoulder, not elsewhere classified ? ?Other muscle spasm ? ?Muscle weakness (generalized) ? ? ? ? ?Problem List ?Patient Active Problem List  ? Diagnosis Date Noted  ? Tendinopathy of rotator cuff, left 04/04/2021  ? Full thickness rotator cuff tear 03/14/2021  ? ? ?BArtist Pais PTA ?09/02/2021, 2:04 PM ? ?[place] ?[facility] [place] ?[address] [address]?Phone: [phone]   Fax:  [phone]? ?Name: Nathaniel Solis?MRN: [id number]?Date of Birth: 312-09-1970? ? ? ?

## 2021-09-05 ENCOUNTER — Ambulatory Visit: Payer: Medicaid Other | Admitting: Physical Therapy

## 2021-09-05 ENCOUNTER — Other Ambulatory Visit: Payer: Self-pay

## 2021-09-05 DIAGNOSIS — M25611 Stiffness of right shoulder, not elsewhere classified: Secondary | ICD-10-CM

## 2021-09-05 DIAGNOSIS — M25511 Pain in right shoulder: Secondary | ICD-10-CM | POA: Diagnosis not present

## 2021-09-05 DIAGNOSIS — M62838 Other muscle spasm: Secondary | ICD-10-CM

## 2021-09-05 DIAGNOSIS — M6281 Muscle weakness (generalized): Secondary | ICD-10-CM

## 2021-09-05 NOTE — Therapy (Signed)
[place] ?[facility] [place] ?[place] ?[place], [place], [address] ?Phone: [phone]   Fax:  [phone] ? ?Physical Therapy Treatment ?Progress Note ?Reporting Period 08/14/2021 to 09/05/2021 ? ?See note below for Objective Data and Assessment of Progress/Goals.  ? ? ? ?Patient Details  ?Name: Nathaniel Solis ?MRN: [id number] ?Date of Birth: 1970-12-04 ?Referring Provider (PT): Tania Ade MD ? ? ?Encounter Date: 09/05/2021 ? ? PT End of Session - 09/05/21 1501   ? ? Visit Number 20   ? Number of Visits 26   ? Date for PT Re-Evaluation 09/25/21   ? Authorization Type [employer]   ? Authorization - Visit Number 17   ? PT Start Time 1500   ? PT Stop Time 2585   ? PT Time Calculation (min) 27 min   ? Activity Tolerance Patient tolerated treatment well   ? Behavior During Therapy [facility] for tasks assessed/performed   ? ?  ?  ? ?  ? ? ?Past Medical History:  ?Diagnosis Date  ? Kidney stone   ? ? ?Past Surgical History:  ?Procedure Laterality Date  ? APPENDECTOMY    ? ? ?There were no vitals filed for this visit. ? ? Subjective Assessment - 09/05/21 1504   ? ? Subjective Arrived 15 min late due to having to take wife to appt. in [place] today.  Denies pain.   ? Currently in Pain? No/denies   ? ?  ?  ? ?  ? ? ? ? ? [facility] PT Assessment - 09/05/21 0001   ? ?  ? Assessment  ? Medical Diagnosis Z98.890 (ICD-10-CM) - S/P rotator cuff repair   ? Referring Provider (PT) Tania Ade MD   ? Onset Date/Surgical Date 05/16/21   ? Hand Dominance Right   ? Next MD Visit none scheduled   ?  ? ROM / Strength  ? AROM / PROM / Strength AROM   ?  ? AROM  ? Overall AROM  Within functional limits for tasks performed   ? Overall AROM Comments some tightness with functional IR reaching behind back but can reach midback.  Otherwise full ROM and no pain.   ?  ? Strength  ? Overall Strength Within functional limits for tasks performed   ? Right Shoulder Flexion 4+/5   ? Right Shoulder  ABduction 5/5   ? Right Shoulder Internal Rotation 5/5   ? Right Shoulder External Rotation 5/5   increased pain  ? ?  ?  ? ?  ? ? ? ? ? ? ? ? ? ? ? ? ? ? ? ? [place] Adult PT Treatment/Exercise - 09/05/21 0001   ? ?  ? Shoulder Exercises: Standing  ? Other Standing Exercises shelf taps with 5# weight 2 x 15, with both hands 10# 2 x 10   ?  ? Shoulder Exercises: ROM/Strengthening  ? UBE (Upper Arm Bike) L1 x 6 min (16f3b)   cues to keep AAROM for surgical side  ? Lat Pull Limitations 25# 2 x 10   ? Cybex Press Limitations 15# 2 x 10   ? Cybex Row Limitations 35# 2 x 10   ? Wall Pushups 10 reps   ? Wall Pushups Limitations on counter   ? ?  ?  ? ?  ? ? ? ? ? ? ? ? ? ? ? ? PT Short Term Goals - 06/27/21 1400   ? ?  ? PT SHORT TERM GOAL #1  ?  Title Ind with initial HEP   ? Time 2   ? Period Weeks   ? Status Achieved   06/27/21  ? Target Date 06/27/21   ? ?  ?  ? ?  ? ? ? ? PT Long Term Goals - 09/05/21 1507   ? ?  ? PT LONG TERM GOAL #1  ? Title Pt. will be independent with progressed HEP for RUE strengthening to improve outcomes.   ? Status Partially Met   08/14/21- met for current.  ? Target Date 09/25/21   ?  ? PT LONG TERM GOAL #2  ? Title Pt. will demonstrate full R shoulder ROM without pain in order to perform overhead job activities.   ? Status Achieved   08/14/21- pain with OH job activites 09/05/21- WNL without pain.  ? Target Date 09/25/21   ?  ? PT LONG TERM GOAL #3  ? Title Pt. will demonstrate 5/5 R shoulder strength without pain in order to start lifting weights.   ? Status Partially Met   08/14/21- still limited overall in R shoulder strength 09/05/21- 4+/5 flexion, pain with resisted ER  ? Target Date 09/25/21   ?  ? PT LONG TERM GOAL #4  ? Title Pt. will be able to lift 40lbs without increased R shoulder pain to return to work.   ? Status On-going   07/18/21- not yet appropriate to test.  ? Target Date 09/25/21   ? ?  ?  ? ?  ? ? ? ? ? ? ? ? Plan - 09/05/21 1530   ? ? Clinical Impression Statement Pt. is making good  progress towards all goals, with improving R shoulder strength, no pain with overhead movements, but still weakness with R shoulder flexion and tightness into internal rotation.  He is in week 16 of his recovery.  Today started more resistance based exericses with good tolerance and no report of pain, just fatigue.  He would benefit from continued skilled therapy.   ? Personal Factors and Comorbidities Comorbidity 3+   ? Comorbidities history L RTC repair, COPD, smoker   ? PT Frequency 2x / week   ? PT Duration 6 weeks   ? PT Treatment/Interventions ADLs/Self Care Home Management;Cryotherapy;Electrical Stimulation;Iontophoresis 11m/ml Dexamethasone;Moist Heat;Ultrasound;Therapeutic activities;Therapeutic exercise;Neuromuscular re-education;Patient/family education;Manual techniques;Scar mobilization;Passive range of motion;Dry needling;Taping;Joint Manipulations   ? PT Next Visit Plan progress ROM and exercises per protocol.  Post op week 15 as of 08/29/21.   ? PT Home Exercise Plan Access Code: [email or web address]  ? Consulted and Agree with Plan of Care Patient   ? ?  ?  ? ?  ? ? ?Patient will benefit from skilled therapeutic intervention in order to improve the following deficits and impairments:  Decreased skin integrity, Increased muscle spasms, Decreased range of motion, Decreased scar mobility, Decreased activity tolerance, Decreased strength, Increased fascial restricitons, Impaired flexibility, Impaired UE functional use, Postural dysfunction, Pain ? ?Visit Diagnosis: ?Acute pain of right shoulder ? ?Stiffness of right shoulder, not elsewhere classified ? ?Other muscle spasm ? ?Muscle weakness (generalized) ? ? ? ? ?Problem List ?Patient Active Problem List  ? Diagnosis Date Noted  ? Tendinopathy of rotator cuff, left 04/04/2021  ? Full thickness rotator cuff tear 03/14/2021  ? ? ?ERennie Natter PT, DPT  ?09/05/2021, 3:31 PM ? ?[place] ?[facility] [place] ?[address] [address]?Phone: [phone]  Fax:  [phone]? ?Name: Nathaniel Solis?MRN: [id number]?  Date of Birth: 1971-03-26 ? ? ? ?

## 2021-09-09 ENCOUNTER — Ambulatory Visit: Payer: Medicaid Other

## 2021-09-09 ENCOUNTER — Other Ambulatory Visit: Payer: Self-pay

## 2021-09-09 DIAGNOSIS — M6281 Muscle weakness (generalized): Secondary | ICD-10-CM

## 2021-09-09 DIAGNOSIS — M62838 Other muscle spasm: Secondary | ICD-10-CM

## 2021-09-09 DIAGNOSIS — M25511 Pain in right shoulder: Secondary | ICD-10-CM

## 2021-09-09 DIAGNOSIS — M25611 Stiffness of right shoulder, not elsewhere classified: Secondary | ICD-10-CM

## 2021-09-09 NOTE — Therapy (Signed)
[name] ?[facility] ?[address] ?[place], [place], [address] ?Phone: [phone]   Fax:  [phone] ? ?Physical Therapy Treatment ? ?Patient Details  ?Name: Nathaniel Solis ?MRN: [id number] ?Date of Birth: 05-Apr-1971 ?Referring Provider (PT): Tania Ade MD ? ? ?Encounter Date: 09/09/2021 ? ? PT End of Session - 09/09/21 1355   ? ? Visit Number 21   ? Number of Visits 26   ? Date for PT Re-Evaluation 09/25/21   ? Authorization Type [employer] MCD   ? Authorization - Visit Number 17   ? PT Start Time 4854   pt late  ? PT Stop Time 1358   ? PT Time Calculation (min) 39 min   ? Activity Tolerance Patient tolerated treatment well   ? Behavior During Therapy [facility] for tasks assessed/performed   ? ?  ?  ? ?  ? ? ?Past Medical History:  ?Diagnosis Date  ? Kidney stone   ? ? ?Past Surgical History:  ?Procedure Laterality Date  ? APPENDECTOMY    ? ? ?There were no vitals filed for this visit. ? ? Subjective Assessment - 09/09/21 1321   ? ? Subjective Pt notes that moving his arm to grab items out of the cabinet and moving his arm behind his back "gives him a fit."   ? Currently in Pain? No/denies   ? ?  ?  ? ?  ? ? ? ? ? ? ? ? ? ? ? ? ? ? ? ? ? ? ? ? [facility] Adult PT Treatment/Exercise - 09/09/21 0001   ? ?  ? Shoulder Exercises: Standing  ? Other Standing Exercises R body blade 3 x 15 sec each (flex and scap)   ? Other Standing Exercises R UE ball tosses with small green ball x 10   ?  ? Shoulder Exercises: Pulleys  ? Flexion 3 minutes   ? ABduction 3 minutes   ?  ? Shoulder Exercises: ROM/Strengthening  ? Cybex Row Limitations 35# 3 x 10   ? Other ROM/Strengthening Exercises isometric ER (red TB) with modified plank on wall 3x10"   ? Other ROM/Strengthening Exercises cabinet reaches with 5lb weight 2x10 flex, abd x 10   ?  ? Shoulder Exercises: Stretch  ? Other Shoulder Stretches R shoulder AAROM with towel behind back ER/IR x 10 each   ? ?  ?  ? ?   ? ? ? ? ? ? ? ? ? ? ? ? PT Short Term Goals - 06/27/21 1400   ? ?  ? PT SHORT TERM GOAL #1  ? Title Ind with initial HEP   ? Time 2   ? Period Weeks   ? Status Achieved   06/27/21  ? Target Date 06/27/21   ? ?  ?  ? ?  ? ? ? ? PT Long Term Goals - 09/05/21 1507   ? ?  ? PT LONG TERM GOAL #1  ? Title Pt. will be independent with progressed HEP for RUE strengthening to improve outcomes.   ? Status Partially Met   08/14/21- met for current.  ? Target Date 09/25/21   ?  ? PT LONG TERM GOAL #2  ? Title Pt. will demonstrate full R shoulder ROM without pain in order to perform overhead job activities.   ? Status Achieved   08/14/21- pain with [place] job activites 09/05/21- WNL without pain.  ? Target Date 09/25/21   ?  ? PT LONG  TERM GOAL #3  ? Title Pt. will demonstrate 5/5 R shoulder strength without pain in order to start lifting weights.   ? Status Partially Met   08/14/21- still limited overall in R shoulder strength 09/05/21- 4+/5 flexion, pain with resisted ER  ? Target Date 09/25/21   ?  ? PT LONG TERM GOAL #4  ? Title Pt. will be able to lift 40lbs without increased R shoulder pain to return to work.   ? Status On-going   07/18/21- not yet appropriate to test.  ? Target Date 09/25/21   ? ?  ?  ? ?  ? ? ? ? ? ? ? ? Plan - 09/09/21 1359   ? ? Clinical Impression Statement Pt continues to respond well to treatment. Still shows difficulty with higher impact exercises like body blade. Cues to reduce UT muscle activation with body blade into scaption. Pt showed muscle quivering and shakiness during cabinet reaches. Pt only noting fatigue in the R shoulder after session.   ? Personal Factors and Comorbidities Comorbidity 3+   ? Comorbidities history L RTC repair, COPD, smoker   ? PT Frequency 2x / week   ? PT Duration 6 weeks   ? PT Treatment/Interventions ADLs/Self Care Home Management;Cryotherapy;Electrical Stimulation;Iontophoresis 72m/ml Dexamethasone;Moist Heat;Ultrasound;Therapeutic activities;Therapeutic exercise;Neuromuscular  re-education;Patient/family education;Manual techniques;Scar mobilization;Passive range of motion;Dry needling;Taping;Joint Manipulations   ? PT Next Visit Plan progress ROM and exercises per protocol.  Post op week 16 as of 09/05/21.   ? PT Home Exercise Plan Access Code: [email or web address]  ? Consulted and Agree with Plan of Care Patient   ? ?  ?  ? ?  ? ? ?Patient will benefit from skilled therapeutic intervention in order to improve the following deficits and impairments:  Decreased skin integrity, Increased muscle spasms, Decreased range of motion, Decreased scar mobility, Decreased activity tolerance, Decreased strength, Increased fascial restricitons, Impaired flexibility, Impaired UE functional use, Postural dysfunction, Pain ? ?Visit Diagnosis: ?Acute pain of right shoulder ? ?Stiffness of right shoulder, not elsewhere classified ? ?Other muscle spasm ? ?Muscle weakness (generalized) ? ? ? ? ?Problem List ?Patient Active Problem List  ? Diagnosis Date Noted  ? Tendinopathy of rotator cuff, left 04/04/2021  ? Full thickness rotator cuff tear 03/14/2021  ? ? ?BArtist Pais PTA ?09/09/2021, 3:12 PM ? ?[facility] ?[facility] ?[address] [address]?Phone: [phone]  Fax:  [phone]? ?Name: Nathaniel Solis?MRN: [id number]?Date of Birth: 311/30/1972? ? ? ?

## 2021-09-11 ENCOUNTER — Ambulatory Visit: Payer: Medicaid Other

## 2021-09-11 DIAGNOSIS — M6281 Muscle weakness (generalized): Secondary | ICD-10-CM

## 2021-09-11 DIAGNOSIS — M25611 Stiffness of right shoulder, not elsewhere classified: Secondary | ICD-10-CM

## 2021-09-11 DIAGNOSIS — M25511 Pain in right shoulder: Secondary | ICD-10-CM | POA: Diagnosis not present

## 2021-09-11 DIAGNOSIS — M62838 Other muscle spasm: Secondary | ICD-10-CM

## 2021-09-11 NOTE — Therapy (Signed)
[place] ?[facility] ?[address] ?[place], [place], [address] ?Phone: [phone]   Fax:  [phone] ? ?Physical Therapy Treatment ? ?Patient Details  ?Name: Nathaniel Solis ?MRN: [id number] ?Date of Birth: 1971-04-19 ?Referring Provider (PT): Tania Ade MD ? ? ?Encounter Date: 09/11/2021 ? ? PT End of Session - 09/11/21 1536   ? ? Visit Number 22   ? Number of Visits 26   ? Date for PT Re-Evaluation 09/25/21   ? Authorization Type [employer]   ? Authorization - Visit Number 17   ? PT Start Time 5366   pt late  ? PT Stop Time 1536   ? PT Time Calculation (min) 38 min   ? Activity Tolerance Patient tolerated treatment well   ? Behavior During Therapy [facility] for tasks assessed/performed   ? ?  ?  ? ?  ? ? ?Past Medical History:  ?Diagnosis Date  ? Kidney stone   ? ? ?Past Surgical History:  ?Procedure Laterality Date  ? APPENDECTOMY    ? ? ?There were no vitals filed for this visit. ? ? Subjective Assessment - 09/11/21 1501   ? ? Subjective Pt reports his shoulder is doing well, was pretty sore after last visit.  ? Currently in Pain? No/denies   ? ?  ?  ? ?  ? ? ? ? ? ? ? ? ? ? ? ? ? ? ? ? ? ? ? ? [facility] Adult PT Treatment/Exercise - 09/11/21 0001   ? ?  ? Shoulder Exercises: Standing  ? Diagonals Strengthening;Both;2 sets;10 reps;Theraband (D1/D2 flexion)  ? Theraband Level (Shoulder Diagonals) Level 3 (Green)   ? Other Standing Exercises R body blade 3 x 20 sec each (flex and scap)   ? Other Standing Exercises R UE ball tosses with small green ball x 10   ?  ? Shoulder Exercises: Pulleys  ? Flexion 3 minutes   ? ABduction 3 minutes   ?  ? Shoulder Exercises: ROM/Strengthening  ? Pushups 20 reps   ? Pushups Limitations from bosu ball; kneeling push up  ? Other ROM/Strengthening Exercises standing ER with blue TB 2x30", small oscillation movements   ? Other ROM/Strengthening Exercises cabinet reaches with 5lb weight x10 flex, abd x 10 to top shelf; B UE 10lb  x 20 to 2nd shelf   ? ?  ?  ? ?  ? ? ? ? ? ? ? ? ? ? ? ? PT Short Term Goals - 06/27/21 1400   ? ?  ? PT SHORT TERM GOAL #1  ? Title Ind with initial HEP   ? Time 2   ? Period Weeks   ? Status Achieved   06/27/21  ? Target Date 06/27/21   ? ?  ?  ? ?  ? ? ? ? PT Long Term Goals - 09/05/21 1507   ? ?  ? PT LONG TERM GOAL #1  ? Title Pt. will be independent with progressed HEP for RUE strengthening to improve outcomes.   ? Status Partially Met   08/14/21- met for current.  ? Target Date 09/25/21   ?  ? PT LONG TERM GOAL #2  ? Title Pt. will demonstrate full R shoulder ROM without pain in order to perform overhead job activities.   ? Status Achieved   08/14/21- pain with [place] job activites 09/05/21- WNL without pain.  ? Target Date 09/25/21   ?  ? PT LONG TERM GOAL #  3  ? Title Pt. will demonstrate 5/5 R shoulder strength without pain in order to start lifting weights.   ? Status Partially Met   08/14/21- still limited overall in R shoulder strength 09/05/21- 4+/5 flexion, pain with resisted ER  ? Target Date 09/25/21   ?  ? PT LONG TERM GOAL #4  ? Title Pt. will be able to lift 40lbs without increased R shoulder pain to return to work.   ? Status On-going   07/18/21- not yet appropriate to test.  ? Target Date 09/25/21   ? ?  ?  ? ?  ? ? ? ? ? ? ? ? Plan - 09/11/21 1537   ? ? Clinical Impression Statement Pt arrived 13 min late to appointment today. He had no issues with the progression of exercises today. Pt able to complete all interventions with cues and instruction provided. 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 Otherwise he is progressing well.   ? Personal Factors and Comorbidities Comorbidity 3+   ? Comorbidities history L RTC repair, COPD, smoker   ? PT Frequency 2x / week   ? PT Duration 6 weeks   ? PT Treatment/Interventions ADLs/Self Care Home Management;Cryotherapy;Electrical Stimulation;Iontophoresis 2m/ml Dexamethasone;Moist  Heat;Ultrasound;Therapeutic activities;Therapeutic exercise;Neuromuscular re-education;Patient/family education;Manual techniques;Scar mobilization;Passive range of motion;Dry needling;Taping;Joint Manipulations   ? PT Next Visit Plan progress ROM and exercises per protocol.  Post op week 17 as of 09/12/21.   ? PT Home Exercise Plan Access Code: [email or web address]  ? Consulted and Agree with Plan of Care Patient   ? ?  ?  ? ?  ? ? ?Patient will benefit from skilled therapeutic intervention in order to improve the following deficits and impairments:  Decreased skin integrity, Increased muscle spasms, Decreased range of motion, Decreased scar mobility, Decreased activity tolerance, Decreased strength, Increased fascial restricitons, Impaired flexibility, Impaired UE functional use, Postural dysfunction, Pain ? ?Visit Diagnosis: ?Acute pain of right shoulder ? ?Stiffness of right shoulder, not elsewhere classified ? ?Other muscle spasm ? ?Muscle weakness (generalized) ? ? ? ? ?Problem List ?Patient Active Problem List  ? Diagnosis Date Noted  ? Tendinopathy of rotator cuff, left 04/04/2021  ? Full thickness rotator cuff tear 03/14/2021  ? ? ?BArtist Pais PTA ?09/11/2021, 5:29 PM ? ?[place] ?[facility] ?[address]?[place] [address]?Phone: [phone]  Fax:  [phone]? ?Name: MKaidin Boehle?MRN: [id number]?Date of Birth: 3Mar 18, 1972? ? ? ?

## 2021-09-16 ENCOUNTER — Encounter: Payer: Self-pay | Admitting: Physical Therapy

## 2021-09-16 ENCOUNTER — Ambulatory Visit: Payer: Medicaid Other | Attending: Orthopedic Surgery | Admitting: Physical Therapy

## 2021-09-16 DIAGNOSIS — M62838 Other muscle spasm: Secondary | ICD-10-CM | POA: Insufficient documentation

## 2021-09-16 DIAGNOSIS — M25511 Pain in right shoulder: Secondary | ICD-10-CM | POA: Diagnosis present

## 2021-09-16 DIAGNOSIS — M6281 Muscle weakness (generalized): Secondary | ICD-10-CM | POA: Insufficient documentation

## 2021-09-16 DIAGNOSIS — M25611 Stiffness of right shoulder, not elsewhere classified: Secondary | ICD-10-CM | POA: Diagnosis present

## 2021-09-16 NOTE — Patient Instructions (Signed)
Access Code: [email or web address] ?URL: [email or web address] ?Date: 09/16/2021 ?Prepared by: Glenetta Hew ? ?Exercises ?- Scaption with Dumbbells  - 1 x daily - 3 x weekly - 3 sets - 10 reps ?- Shoulder Overhead Press in Abduction with Dumbbells  - 1 x daily - 3 x weekly - 3 sets - 10 reps ?- Shoulder Overhead Press in Flexion with Dumbbells  - 1 x daily - 3 x weekly - 3 sets - 10 reps ?- Shoulder PNF D2 with Resistance  - 1 x daily - 3 x weekly - 3 sets - 10 reps ?- Standing Single Arm Shoulder Abduction with Resistance  - 1 x daily - 3 x weekly - 3 sets - 10 reps ?- Isometric Shoulder Abduction with Resistance Loop  - 1 x daily - 3 x weekly - 1 sets - 10 reps - 30 sec  hold ?

## 2021-09-16 NOTE — Therapy (Signed)
[place] ?[facility] ?[place] ?[place], [place], [address] ?Phone: [phone]   Fax:  [phone] ? ?Physical Therapy Treatment ? ?Patient Details  ?Name: Nathaniel Solis ?MRN: [id number] ?Date of Birth: [birth year] ?Referring Provider (PT): Tania Ade MD ? ? ?Encounter Date: 09/16/2021 ? ? PT End of Session - 09/16/21 1453   ? ? Visit Number 23   ? Number of Visits 26   ? Date for PT Re-Evaluation 09/25/21   ? Authorization Type [employer] M[employer]   ? Authorization - Visit Number --   ? PT Start Time 4332   ? PT Stop Time 9518   ? PT Time Calculation (min) 55 min   ? Activity Tolerance Patient tolerated treatment well   ? Behavior During Therapy [facility] for tasks assessed/performed   ? ?  ?  ? ?  ? ? ?Past Medical History:  ?Diagnosis Date  ? Kidney stone   ? ? ?Past Surgical History:  ?Procedure Laterality Date  ? APPENDECTOMY    ? ? ?There were no vitals filed for this visit. ? ? Subjective Assessment - 09/16/21 1454   ? ? Subjective Pt. reports no pain at rest but increased pain with overhead movement.   ? Currently in Pain? No/denies   ? ?  ?  ? ?  ? ? ? ? ? [facility] PT Assessment - 09/16/21 0001   ? ?  ? Assessment  ? Medical Diagnosis Z98.890 (ICD-10-CM) - S/P rotator cuff repair   ? Referring Provider (PT) Tania Ade MD   ? Onset Date/Surgical Date 05/16/21   ? Hand Dominance Right   ? Next MD Visit none scheduled   ?  ? Precautions  ? Precautions None   ? Precaution Comments week 17 post-op - Phase 4 advanced strengthening, no restrictions   ?  ? Restrictions  ? Weight Bearing Restrictions No   ?  ? Strength  ? Overall Strength Within functional limits for tasks performed   ? Right Shoulder Flexion 5/5   ? Right Shoulder ABduction 5/5   ? Right Shoulder Internal Rotation 5/5   ? Right Shoulder External Rotation 5/5   ? ?  ?  ? ?  ? ? ? ? ? ? ? ? ? ? ? ? ? ? ? ? [facility] Adult PT Treatment/Exercise - 09/16/21 0001   ? ?  ? Shoulder Exercises: Standing  ?  Flexion Strengthening;Right;20 reps;Weights   ? Shoulder Flexion Weight (lbs) 4   ? ABduction Strengthening;Right;10 reps;Weights   ? Shoulder ABduction Weight (lbs) 2   ? Diagonals Strengthening;Right;20 reps;Theraband   ? Theraband Level (Shoulder Diagonals) Level 3 (Green)   ? Diagonals Limitations chops/reverse chops 2 x 10 each direction, D1/D2 flex 2 x 10   ? Other Standing Exercises overhead presses 4# 3 x 10   ?  ? Shoulder Exercises: Pulleys  ? Flexion 3 minutes   ? ABduction 3 minutes   ?  ? Shoulder Exercises: ROM/Strengthening  ? Lat Pull Limitations 25# x 15, 35# x 15   ? Cybex Press Limitations 15# x 15, 25# x 10   ? Cybex Row Limitations 35# 2 x 15   ? Other ROM/Strengthening Exercises overhead press 10# - unable, 5# x 10   ?  ? Shoulder Exercises: Isometric Strengthening  ? Flexion 5X10"   ? Flexion Limitations with 2# weigth, 90 deg flexion   ? ABduction 5X10"   ? ABduction Limitations with RTB   ? ?  ?  ? ?  ? ? ? ? ? ? ? ? ? ?  PT Education - 09/16/21 1558   ? ? Education Details HEP update.   ? Person(s) Educated Patient   ? Methods Explanation;Demonstration;Verbal cues;Handout   ? Comprehension Verbalized understanding;Returned demonstration   ? ?  ?  ? ?  ? ? ? PT Short Term Goals - 06/27/21 1400   ? ?  ? PT SHORT TERM GOAL #1  ? Title Ind with initial HEP   ? Time 2   ? Period Weeks   ? Status Achieved   06/27/21  ? Target Date 06/27/21   ? ?  ?  ? ?  ? ? ? ? PT Long Term Goals - 09/16/21 1521   ? ?  ? PT LONG TERM GOAL #1  ? Title Pt. will be independent with progressed HEP for RUE strengthening to improve outcomes.   ? Status Partially Met   08/14/21- met for current.  ? Target Date 09/25/21   ?  ? PT LONG TERM GOAL #2  ? Title Pt. will demonstrate full R shoulder ROM without pain in order to perform overhead job activities.   ? Status Achieved   08/14/21- pain with [place] job activites 09/05/21- WNL without pain.  ? Target Date 09/25/21   ?  ? PT LONG TERM GOAL #3  ? Title Pt. will demonstrate 5/5 R  shoulder strength without pain in order to start lifting weights.   ? Status Achieved   08/14/21- still limited overall in R shoulder strength 09/05/21- 4+/5 flexion, pain with resisted ER  ? Target Date 09/25/21   ?  ? PT LONG TERM GOAL #4  ? Title Pt. will be able to lift 40lbs without increased R shoulder pain to return to work.   ? Status On-going   07/18/21- not yet appropriate to test.  09/16/21- can lift 4# overhead.  ? Target Date 09/25/21   ? ?  ?  ? ?  ? ? ? ? ? ? ? ? Plan - 09/16/21 1554   ? ? Clinical Impression Statement Pt. reports only having pain in R shoulder with overhead lifting.  He is in Phase 4 - advanced strengthening of post-surgical protocol now.  Tolerated all exercises well with exception of overhead press at 10# but could lift 5#.  Still demonstrates muscle fatigue and weakness with resistance, educated at length on exercises to strengthen in overhead range and build endurance, but time is main factor now.  Encouraged to start back at gym with trainer as he has good strength and ROM.   Due to visit limitations, plan to discharge next visit in order to keep some visits if needs in the future.   ? Personal Factors and Comorbidities Comorbidity 3+   ? Comorbidities history L RTC repair, COPD, smoker   ? PT Frequency 2x / week   ? PT Duration 6 weeks   ? PT Treatment/Interventions ADLs/Self Care Home Management;Cryotherapy;Electrical Stimulation;Iontophoresis 99m/ml Dexamethasone;Moist Heat;Ultrasound;Therapeutic activities;Therapeutic exercise;Neuromuscular re-education;Patient/family education;Manual techniques;Scar mobilization;Passive range of motion;Dry needling;Taping;Joint Manipulations   ? PT Next Visit Plan progress ROM and exercises per protocol.  Post op week 17 as of 09/12/21.   ? PT Home Exercise Plan Access Code: [email or web address]  ? Consulted and Agree with Plan of Care Patient   ? ?  ?  ? ?  ? ? ?Patient will benefit from skilled therapeutic intervention in order to improve the following  deficits and impairments:  Decreased skin integrity, Increased muscle spasms, Decreased range of motion, Decreased scar mobility, Decreased activity tolerance, Decreased  strength, Increased fascial restricitons, Impaired flexibility, Impaired UE functional use, Postural dysfunction, Pain ? ?Visit Diagnosis: ?Acute pain of right shoulder ? ?Stiffness of right shoulder, not elsewhere classified ? ?Other muscle spasm ? ?Muscle weakness (generalized) ? ? ? ? ?Problem List ?Patient Active Problem List  ? Diagnosis Date Noted  ? Tendinopathy of rotator cuff, left 04/04/2021  ? Full thickness rotator cuff tear 03/14/2021  ? ? ?Rennie Natter, PT, DPT  ?09/16/2021, 4:01 PM ? ?[place] ?[facility] ?[address] ?[place], [place], [address] ?Phone: [phone]   Fax:  [phone] ? ?Name: Nathaniel Solis ?MRN: [id number] ?Date of Birth: 08/17/1970 ? ? ? ?

## 2021-09-19 ENCOUNTER — Encounter: Payer: Medicaid Other | Admitting: Physical Therapy

## 2021-09-25 ENCOUNTER — Ambulatory Visit: Payer: Medicaid Other | Admitting: Physical Therapy

## 2021-09-25 ENCOUNTER — Encounter: Payer: Self-pay | Admitting: Physical Therapy

## 2021-09-25 DIAGNOSIS — M62838 Other muscle spasm: Secondary | ICD-10-CM

## 2021-09-25 DIAGNOSIS — M6281 Muscle weakness (generalized): Secondary | ICD-10-CM

## 2021-09-25 DIAGNOSIS — M25611 Stiffness of right shoulder, not elsewhere classified: Secondary | ICD-10-CM

## 2021-09-25 DIAGNOSIS — M25511 Pain in right shoulder: Secondary | ICD-10-CM

## 2021-09-25 NOTE — Therapy (Signed)
[place] [facility] ?[place] ?[place], [place], [address] ?Phone: [phone]   Fax:  [phone] ? ?Physical Therapy Treatment/Discharge ? ?PHYSICAL THERAPY DISCHARGE SUMMARY ? ?Visits from Start of Care: 24 ? ?Current functional level related to goals / functional outcomes: ?Improved R shoulder strength, ROM WNL, FOTO 65% ?  ?Remaining deficits: ?Ant/lateral deltoid weakness with eccentric phase ?  ?Education / Equipment: ?HEP  ?Plan: ?Patient agrees to discharge.  Patient is being discharged due to meeting the stated rehab goals.    ? ? ? ?Patient Details  ?Name: Nathaniel Solis ?MRN: [id number] ?Date of Birth: 08-12-1970 ?Referring Provider (PT): Tania Ade MD ? ? ?Encounter Date: 09/25/2021 ? ? PT End of Session - 09/25/21 6962   ? ? Visit Number 24   ? Number of Visits 26   ? Date for PT Re-Evaluation 09/25/21   ? Authorization Type [employer]   ? PT Start Time [phone]   ? PT Stop Time 4132   ? PT Time Calculation (min) 40 min   ? Activity Tolerance Patient tolerated treatment well   ? Behavior During Therapy [facility] for tasks assessed/performed   ? ?  ?  ? ?  ? ? ?Past Medical History:  ?Diagnosis Date  ? Kidney stone   ? ? ?Past Surgical History:  ?Procedure Laterality Date  ? APPENDECTOMY    ? ? ?There were no vitals filed for this visit. ? ? Subjective Assessment - 09/25/21 0808   ? ? Subjective Pt. reports shoulder is doing well, as are exercises.   ? Currently in Pain? No/denies   ? ?  ?  ? ?  ? ? ? ? ? [facility] PT Assessment - 09/25/21 0001   ? ?  ? Assessment  ? Medical Diagnosis Z98.890 (ICD-10-CM) - S/P rotator cuff repair   ? Referring Provider (PT) Tania Ade MD   ? Onset Date/Surgical Date 05/16/21   ? Hand Dominance Right   ?  ? Precautions  ? Precautions None   ? Precaution Comments week 18 post-op - Phase 4 advanced strengthening, no restrictions   ?  ? Restrictions  ? Weight Bearing Restrictions No   ?  ? Observation/Other  Assessments  ? Focus on Therapeutic Outcomes (FOTO)  shoulder 65%   ?  ? AROM  ? Right Shoulder Extension 55 Degrees   ? Right Shoulder Flexion 165 Degrees   ? Right Shoulder ABduction 170 Degrees   ? Right Shoulder Internal Rotation 70 Degrees   functional to T12  ? Right Shoulder External Rotation 65 Degrees   functional to T4  ?  ? Strength  ? Overall Strength Within functional limits for tasks performed   ? Right Shoulder Flexion 5/5   ? Right Shoulder ABduction 4+/5   ? Right Shoulder Internal Rotation 5/5   ? Right Shoulder External Rotation 5/5   ? ?  ?  ? ?  ? ? ? ? ? ? ? ? ? ? ? ? ? ? ? ? [facility] Adult PT Treatment/Exercise - 09/25/21 0001   ? ?  ? Therapeutic Activites   ? Therapeutic Activities Lifting   ? Lifting lifitng progressively heavier weights from floor to counter, starting 20#, increasing to 40#.  Noted increased L shoulder pain (mild) with 40#.  Cues to bend knees to decrease load on low back.   ?  ? Exercises  ? Exercises Shoulder   ?  ? Neck Exercises: Machines for Strengthening  ?  Nustep L5 x 5 min   ?  ? Shoulder Exercises: Standing  ? Flexion Strengthening;Right;Weights;10 reps   ? Shoulder Flexion Weight (lbs) 4   ? Flexion Limitations focusing on eccentric (lowering phase)   ?  ? Manual Therapy  ? Manual Therapy Soft tissue mobilization   ? Manual therapy comments to decrease muscle spasm/pain   ? Soft tissue mobilization IASTM and STM with s/s tools to R anterior shoulder focusing on pectoralis minor, proximal biceps and anterior delt.   ? ?  ?  ? ?  ? ? ? ? ? ? ? ? ? ? ? ? PT Short Term Goals - 06/27/21 1400   ? ?  ? PT SHORT TERM GOAL #1  ? Title Ind with initial HEP   ? Time 2   ? Period Weeks   ? Status Achieved   06/27/21  ? Target Date 06/27/21   ? ?  ?  ? ?  ? ? ? ? PT Long Term Goals - 09/25/21 0809   ? ?  ? PT LONG TERM GOAL #1  ? Title Pt. will be independent with progressed HEP for RUE strengthening to improve outcomes.   ? Status Achieved   08/14/21- met for current.  09/25/21- met  for current.  ? Target Date 09/25/21   ?  ? PT LONG TERM GOAL #2  ? Title Pt. will demonstrate full R shoulder ROM without pain in order to perform overhead job activities.   ? Status Achieved   08/14/21- pain with OH job activites 09/05/21- WNL without pain.  ? Target Date 09/25/21   ?  ? PT LONG TERM GOAL #3  ? Title Pt. will demonstrate 5/5 R shoulder strength without pain in order to start lifting weights.   ? Status Achieved   08/14/21- still limited overall in R shoulder strength 09/05/21- 4+/5 flexion, pain with resisted ER  ? Target Date 09/25/21   ?  ? PT LONG TERM GOAL #4  ? Title Pt. will be able to lift 40lbs without increased R shoulder pain to return to work.   ? Status Partially Met   07/18/21- not yet appropriate to test.  09/16/21- can lift 4# overhead. 09/25/21- can lift 40# from floor to counter.  ? Target Date 09/25/21   ? ?  ?  ? ?  ? ? ? ? ? ? ? ? Plan - 09/25/21 0956   ? ? Clinical Impression Statement Pt. has made overall very good progress, reports R shoulder is more functional than L shoulder now.  He still has difficulty with eccentric control in deltoids with lowering objects, and reports pain/pulling in anterior shoulder with reaching backwards at end range.  Otherwise he demonstrates good R shoulder strength and his ROM is functional.  He has met all of his goals except lifting 40lbs overhead- he can lift 40lbs from floor to counter but does report increased R shoulder strain.  Discussed continued strengthening of R shoulder and scar tissue mobilization and stretches for anterior shoulder to continue to improve ROM, but also discussed that reaching back and pulling does place the most strain on the shoulder.  He is ready for discharge.   ? Personal Factors and Comorbidities Comorbidity 3+   ? Comorbidities history L RTC repair, COPD, smoker   ? PT Frequency 2x / week   ? PT Duration 6 weeks   ? PT Treatment/Interventions ADLs/Self Care Home Management;Cryotherapy;Electrical  Stimulation;Iontophoresis 74m/ml Dexamethasone;Moist Heat;Ultrasound;Therapeutic activities;Therapeutic exercise;Neuromuscular re-education;Patient/family education;Manual techniques;Scar  mobilization;Passive range of motion;Dry needling;Taping;Joint Manipulations   ? PT Next Visit Plan progress ROM and exercises per protocol.  Post op week 17 as of 09/12/21.   ? PT Home Exercise Plan Access Code: [email or web address]   ? Consulted and Agree with Plan of Care Patient   ? ?  ?  ? ?  ? ? ?Patient will benefit from skilled therapeutic intervention in order to improve the following deficits and impairments:  Decreased skin integrity, Increased muscle spasms, Decreased range of motion, Decreased scar mobility, Decreased activity tolerance, Decreased strength, Increased fascial restricitons, Impaired flexibility, Impaired UE functional use, Postural dysfunction, Pain ? ?Visit Diagnosis: ?Acute pain of right shoulder ? ?Stiffness of right shoulder, not elsewhere classified ? ?Other muscle spasm ? ?Muscle weakness (generalized) ? ? ? ? ?Problem List ?Patient Active Problem List  ? Diagnosis Date Noted  ? Tendinopathy of rotator cuff, left 04/04/2021  ? Full thickness rotator cuff tear 03/14/2021  ? ? ?Rennie Natter, PT, DPT  ?09/25/2021, 10:02 AM ? ?[place] [facility] ?[place] ?[place], [place], [address] ?Phone: [phone]   Fax:  [phone] ? ?Name: Marqueze Ramcharan ?MRN: [id number] ?Date of Birth: [birth year] ? ? ? ?

## 2022-01-08 IMAGING — MR MR SHOULDER*R* W/O CM
4 of 5 series · 21 of 40 positions shown · non-contrast
Comparison: X-ray shoulder 03/11/2021.

CLINICAL DATA: Nondisplaced fracture. Patient reports he fell off a
ladder injuring his right shoulder x 2 months ago. He reports having
popping sensation.

EXAM:
MRI OF THE RIGHT SHOULDER WITHOUT CONTRAST
TECHNIQUE: Multiplanar, multisequence MR imaging of the shoulder was performed.
No intravenous contrast was administered.

[Series 6: T2 fat-sat · axial · right · 3.0mm · 0.47mm/px · z∈[-28,+71]mm · 8 of 27 slices shown (1 of 3)]
[im 1/27]
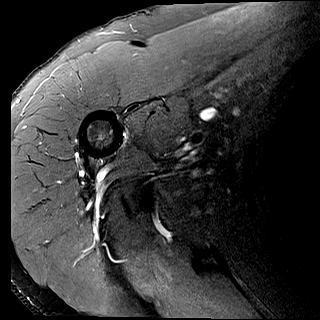
[im 3/27]
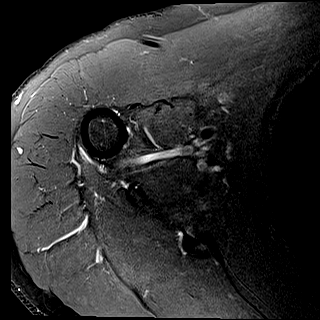
[im 9/27]
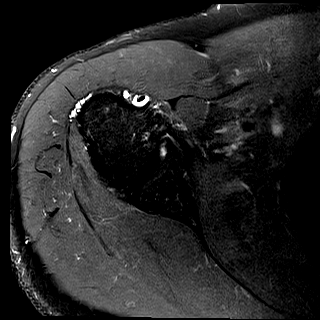
[im 12/27]
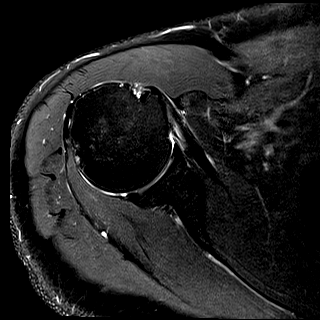
[im 15/27]
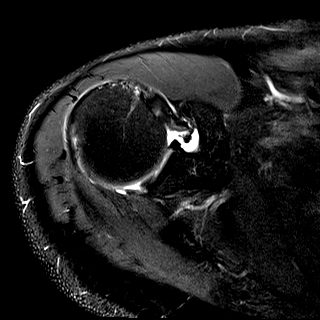
[im 18/27]
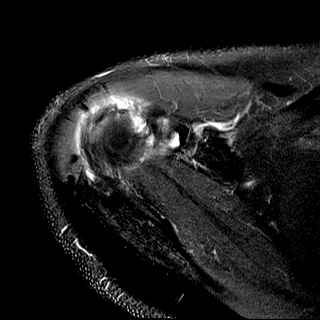
[im 24/27]
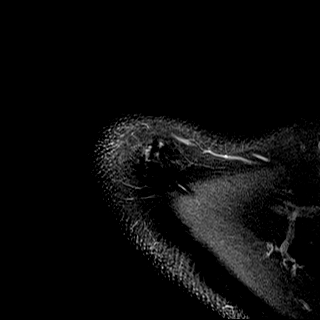
[im 27/27]
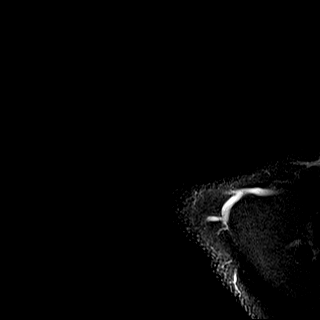

[Series 7: T2 fat-sat · oblique · right · 4.0mm · 0.22mm/px · 3 of 21 slices shown (2 of 3)]
[im 4/21]
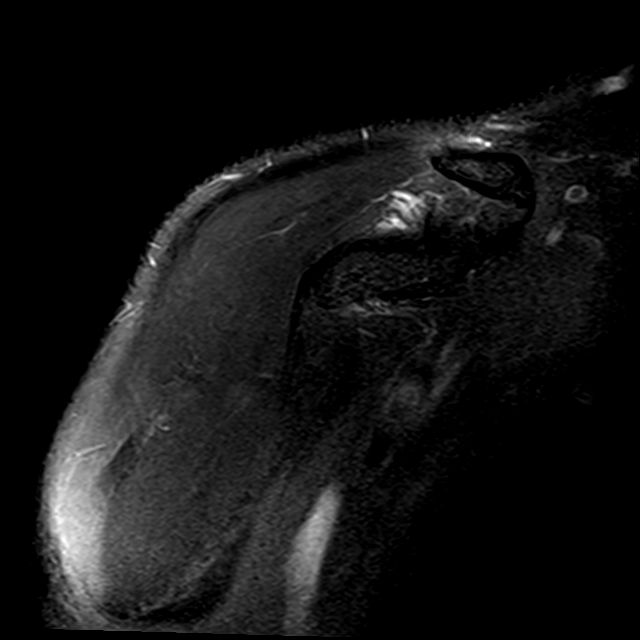
[im 11/21]
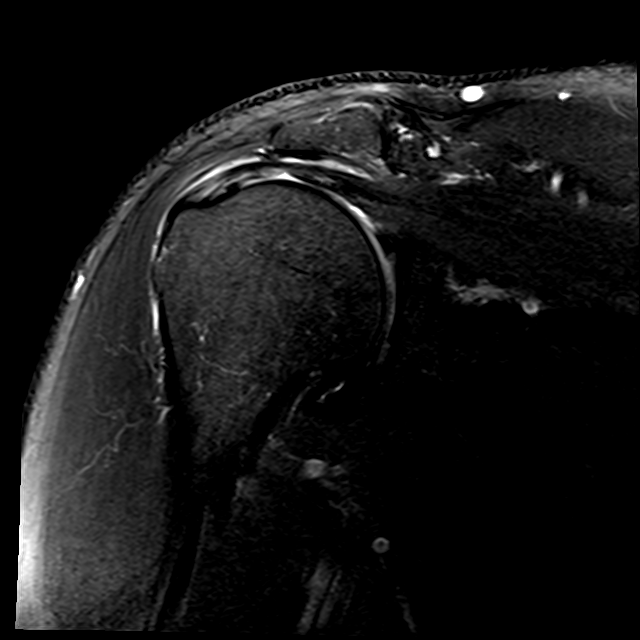
[im 17/21]
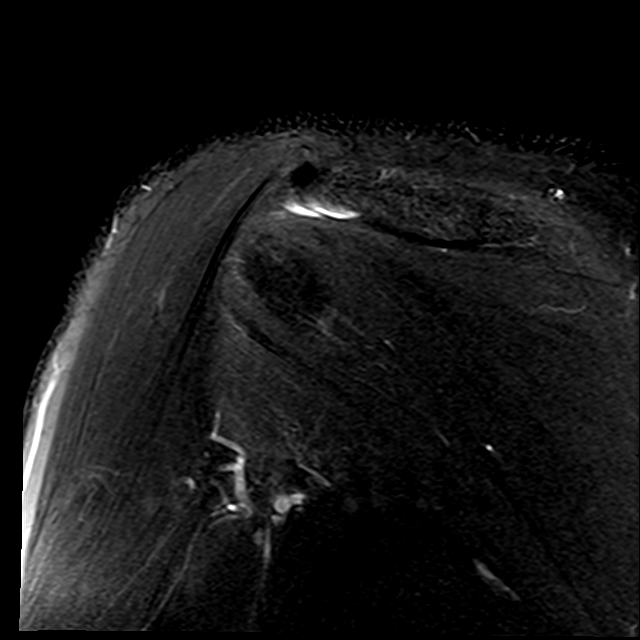

[Series 8: PD · oblique · right · 4.0mm · 0.22mm/px · 7 of 21 slices shown]
[im 1/21]
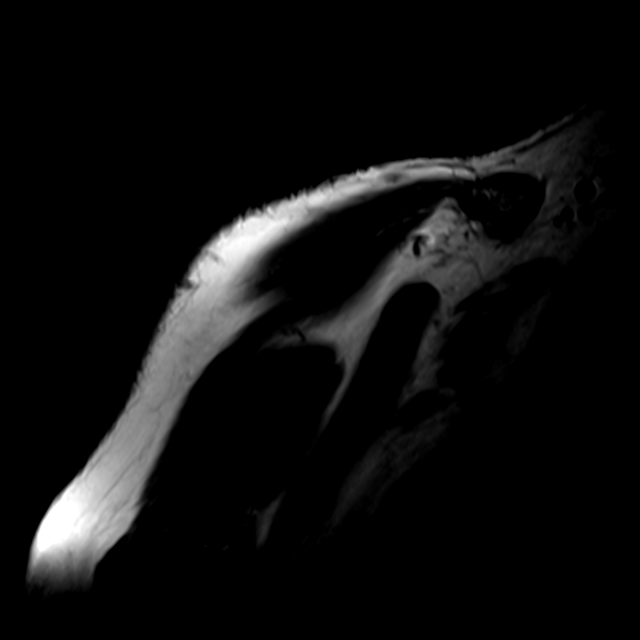
[im 4/21]
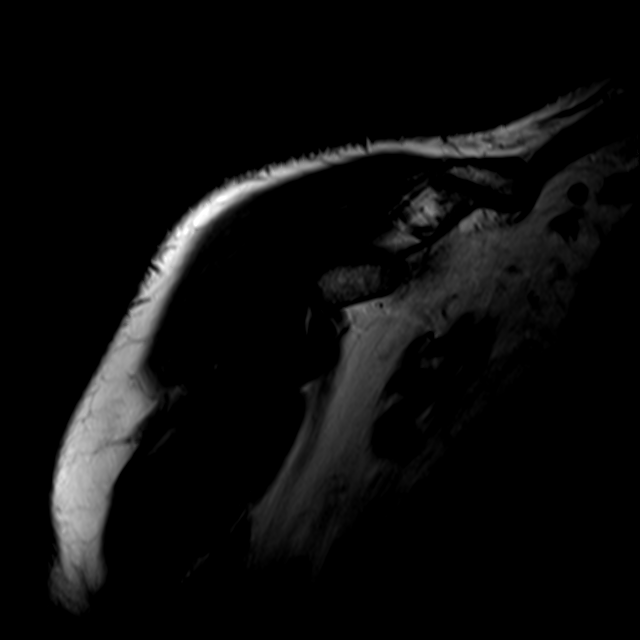
[im 7/21]
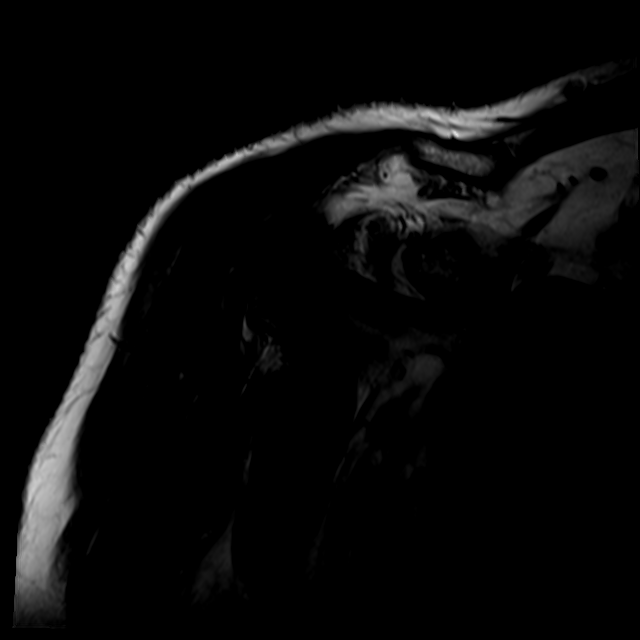
[im 11/21]
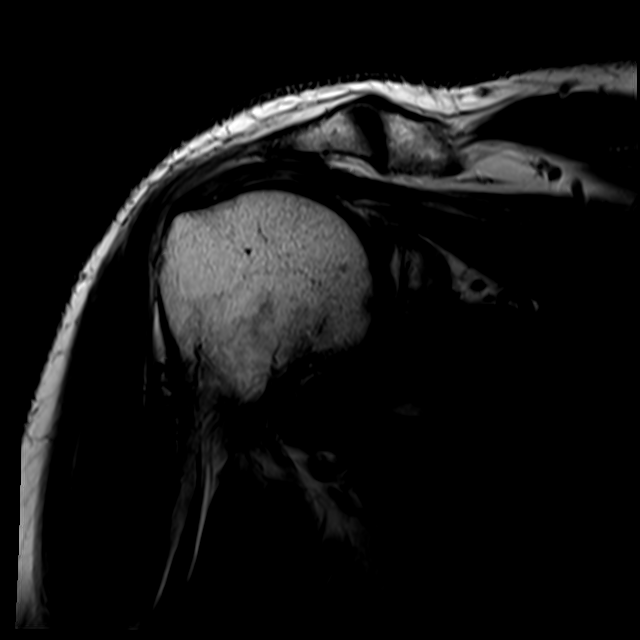
[im 14/21]
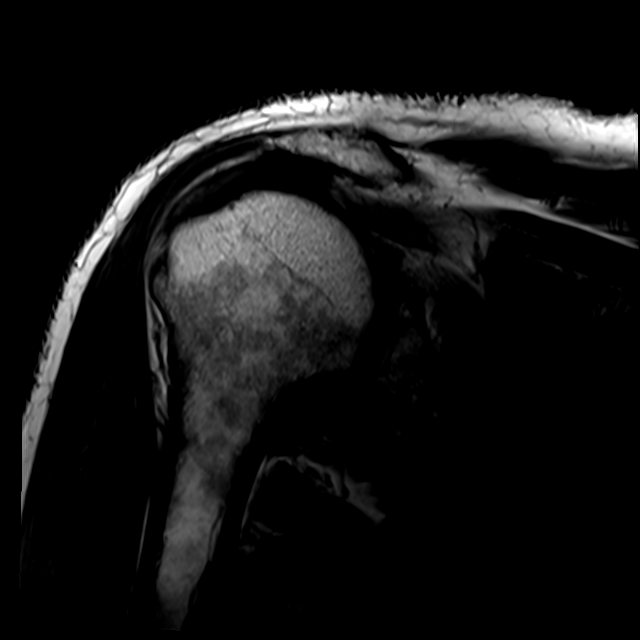
[im 17/21]
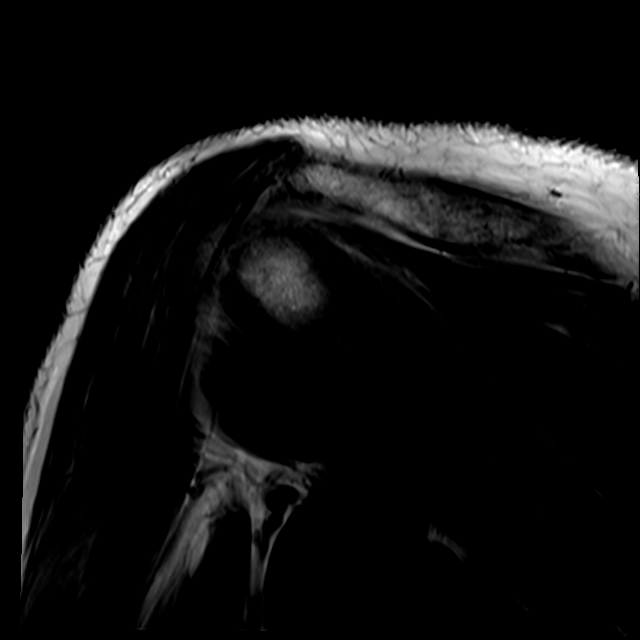
[im 21/21]
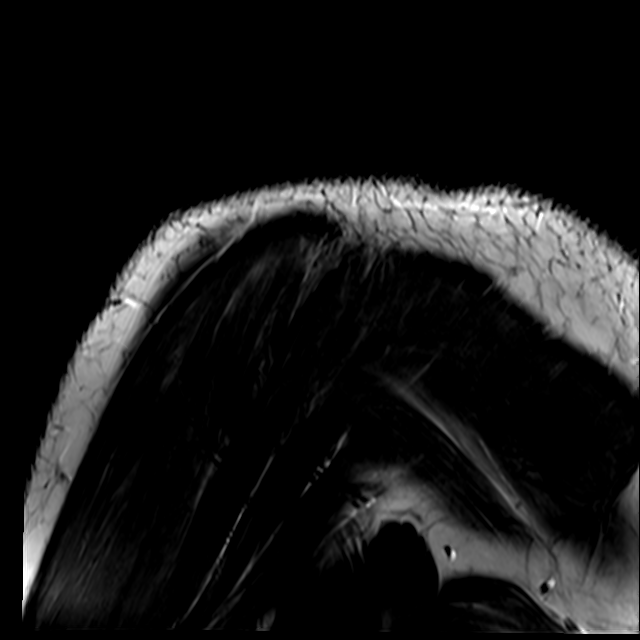

[Series 9: T2 fat-sat · oblique · right · 4.0mm · 0.44mm/px · 3 of 23 slices shown (3 of 3)]
[im 4/23]
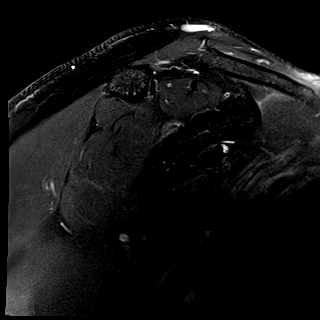
[im 13/23]
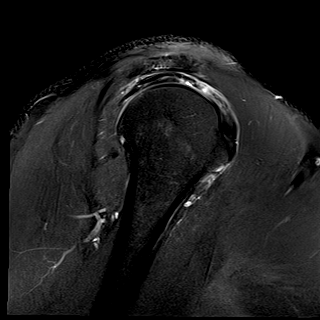
[im 19/23]
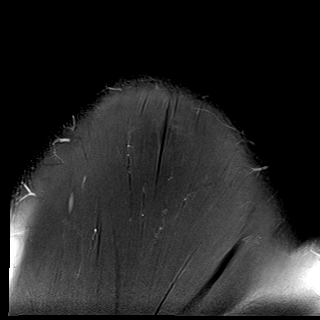

[21 of 40 positions shown; findings below may reference images not displayed]

FINDINGS: Rotator cuff: Moderate tendinosis of the supraspinatus tendon with a
12 mm full-thickness tear anteriorly. Moderate tendinosis of the
infraspinatus tendon. Teres minor tendon is intact. Mild tendinosis
of the subscapularis tendon.

Muscles: No muscle atrophy or edema. No intramuscular fluid
collection or hematoma.

Biceps Long Head: Intraarticular and extraarticular portions of the
biceps tendon are intact.

Acromioclavicular Joint: Mild arthropathy of the acromioclavicular
joint. Trace subacromial/subdeltoid bursal fluid.

Glenohumeral Joint: No joint effusion. No chondral defect.

Labrum: Grossly intact, but evaluation is limited by lack of
intraarticular fluid/contrast.

Bones: No fracture or dislocation. No aggressive osseous lesion.

Other: No fluid collection or hematoma.
IMPRESSION: 1. Moderate tendinosis of the supraspinatus tendon with a 12 mm
full-thickness tear anteriorly.
2. Moderate tendinosis of the infraspinatus tendon.
3. Mild tendinosis of the subscapularis tendon.

## 2022-09-05 ENCOUNTER — Other Ambulatory Visit: Payer: Self-pay | Admitting: Family Medicine

## 2022-09-29 ENCOUNTER — Encounter: Payer: Self-pay | Admitting: *Deleted

## 2023-04-01 ENCOUNTER — Encounter (HOSPITAL_BASED_OUTPATIENT_CLINIC_OR_DEPARTMENT_OTHER): Payer: Self-pay | Admitting: Emergency Medicine

## 2023-04-01 ENCOUNTER — Emergency Department (HOSPITAL_BASED_OUTPATIENT_CLINIC_OR_DEPARTMENT_OTHER): Payer: Medicaid Other

## 2023-04-01 ENCOUNTER — Emergency Department (HOSPITAL_BASED_OUTPATIENT_CLINIC_OR_DEPARTMENT_OTHER)
Admission: EM | Admit: 2023-04-01 | Discharge: 2023-04-01 | Disposition: A | Discharge status: Home / Self Care | Payer: Medicaid Other | Attending: Emergency Medicine | Admitting: Emergency Medicine

## 2023-04-01 DIAGNOSIS — W11XXXA Fall on and from ladder, initial encounter: Secondary | ICD-10-CM | POA: Diagnosis not present

## 2023-04-01 DIAGNOSIS — S3992XA Unspecified injury of lower back, initial encounter: Secondary | ICD-10-CM | POA: Diagnosis present

## 2023-04-01 DIAGNOSIS — S29012A Strain of muscle and tendon of back wall of thorax, initial encounter: Secondary | ICD-10-CM | POA: Diagnosis not present

## 2023-04-01 DIAGNOSIS — S335XXA Sprain of ligaments of lumbar spine, initial encounter: Secondary | ICD-10-CM | POA: Insufficient documentation

## 2023-04-01 MED ORDER — METHOCARBAMOL 500 MG PO TABS: 500.0000 mg | ORAL_TABLET | Freq: Four times a day (QID) | ORAL | 0 refills | Status: AC | PRN

## 2023-04-01 MED ORDER — KETOROLAC TROMETHAMINE 30 MG/ML IJ SOLN
60.0000 mg | Freq: Once | INTRAMUSCULAR | Status: AC
  Administered 2023-04-01: 60 mg via INTRAMUSCULAR
  Filled 2023-04-01: qty 2

## 2023-04-01 MED ORDER — OXYCODONE-ACETAMINOPHEN 5-325 MG PO TABS: 1.0000 | ORAL_TABLET | Freq: Four times a day (QID) | ORAL | 0 refills | Status: AC | PRN

## 2023-04-01 MED ORDER — IBUPROFEN 800 MG PO TABS: 800.0000 mg | ORAL_TABLET | Freq: Three times a day (TID) | ORAL | 0 refills | Status: AC

## 2023-04-01 MED ORDER — HYDROCODONE-ACETAMINOPHEN 5-325 MG PO TABS
2.0000 | ORAL_TABLET | Freq: Once | ORAL | Status: AC
  Administered 2023-04-01: 2 via ORAL
  Filled 2023-04-01: qty 2

## 2023-04-01 NOTE — Discharge Instructions (Addendum)
1.  Take ibuprofen 800 mg every 8 hours for your baseline pain control.  This will help with inflammation.  If you need additional pain control, you may take 1 Percocet tablet every 6 hours with the ibuprofen.  You may take Robaxin every 4-6 hours for muscle spasms.  Apply warm moist heat to your back.  This may be helpful. 2.  Schedule a follow-up with your doctor.  If you continue to have pain and need physical therapy or referral to a spine specialist you should have a follow-up appointment scheduled. The.  Return to the emergency department immediately if you get weakness or numbness in your legs, difficulty with function of her bowel or your bladder, suddenly worsening pain or other concerning changes.

## 2023-04-01 NOTE — ED Triage Notes (Signed)
Pt sts he fell off ladder from about 11ft on Saturday; c/o lower back pain that is worsening

## 2023-04-01 NOTE — ED Provider Notes (Signed)
[facility] Provider Note   CSN: [id number] Arrival date & time: 04/01/23  1328     History  Chief Complaint  Patient presents with   Fall   Back Pain    Nathaniel Solis is a 52 y.o. male.  HPI Patient reports that Saturday, 4 days ago he was on a ladder approximately 12 feet off the ground.  He reports a board fell and knocked the ladder out from under him.  He reports that he fell directly onto his buttocks onto the ground.  He reports that it was painful but he did not have any weakness or numbness and was walking all right.  He did not have any abdominal pain or chest pain.  No shortness of breath.  He reports that over the course of the ensuing days pain has worsened and he has a lot more pain and stiffness in the middle of his back both in the lower back and in the middle upper back.  Still he does not have numbness or tingling to the arms or legs.  Normal bowel and bladder function and no abdominal pain.  He has been ambulatory.    Home Medications Prior to Admission medications   Medication Sig Start Date End Date Taking? Authorizing Provider  ibuprofen (ADVIL) 800 MG tablet Take 1 tablet (800 mg total) by mouth 3 (three) times daily. 04/01/23  Yes Arby Barrette, MD  methocarbamol (ROBAXIN) 500 MG tablet Take 1 tablet (500 mg total) by mouth every 6 (six) hours as needed for muscle spasms. 04/01/23  Yes Arby Barrette, MD  oxyCODONE-acetaminophen (PERCOCET/ROXICET) 5-325 MG tablet Take 1 tablet by mouth every 6 (six) hours as needed for severe pain (pain score 7-10). 04/01/23  Yes Arby Barrette, MD  meloxicam (MOBIC) 7.5 MG tablet TAKE 1 TABLET BY MOUTH TWICE A DAY AS NEEDED 06/20/21   Myra Rude, MD  Methocarbamol (ROBAXIN PO) Take by mouth.    [provider]  nitroGLYCERIN (NITRODUR - DOSED IN MG/24 HR) 0.2 mg/hr patch CUT AND APPLY 1/4 PATCH TO MOST PAINFUL AREA EVERY 24 HOURS 09/08/22   Myra Rude, MD   predniSONE (DELTASONE) 5 MG tablet Take 6 pills for first day, 5 pills second day, 4 pills third day, 3 pills fourth day, 2 pills the fifth day, and 1 pill sixth day. Patient not taking: Reported on 06/13/2021 03/14/21   Myra Rude, MD      Allergies    Doxycycline    Review of Systems   Review of Systems  Physical Exam Updated Vital Signs BP 113/79   Pulse 64   Temp 98.2 F (36.8 C) (Oral)   Resp 18   Ht 5\' 10"  (1.778 m)   Wt 94.3 kg   SpO2 97%   BMI 29.84 kg/m  Physical Exam Constitutional:      Comments: Patient is alert and nontoxic.  No respiratory distress.  Clear mental status.  HENT:     Head: Normocephalic and atraumatic.     Mouth/Throat:     Pharynx: Oropharynx is clear.  Eyes:     Extraocular Movements: Extraocular movements intact.  Cardiovascular:     Rate and Rhythm: Normal rate and regular rhythm.  Pulmonary:     Effort: Pulmonary effort is normal.     Breath sounds: Normal breath sounds.  Chest:     Chest wall: No tenderness.  Abdominal:     General: There is no distension.  Palpations: Abdomen is soft.     Tenderness: There is no abdominal tenderness. There is no guarding.  Musculoskeletal:     Comments: Patient has focal discomfort to palpation of the midline of the lumbar spine low at about L4-5 and just at the sacrum.  No visible bruising in this area.  Also some focal points of reproducible pain approximately at T7 and 8.  Normal range of motion of the extremities.  No deformities of arms or legs.  Skin:    General: Skin is warm and dry.  Neurological:     Comments: Normal neurologic function.  Mental status is clear.  Movements are coordinated purposeful symmetric.  Patient can go from supine sitting standing and ambulatory without limitation.     ED Results / Procedures / Treatments   Labs (all labs ordered are listed, but only abnormal results are displayed) Labs Reviewed - No data to display  EKG None  Radiology CT  Thoracic Spine Wo Contrast  Result Date: 04/01/2023 CLINICAL DATA:  Fell 12 feet off ladder onto buttocks EXAM: CT THORACIC AND LUMBAR SPINE WITHOUT CONTRAST TECHNIQUE: Multidetector CT imaging of the thoracic and lumbar spine was performed without contrast. Multiplanar CT image reconstructions were also generated. RADIATION DOSE REDUCTION: This exam was performed according to the departmental dose-optimization program which includes automated exposure control, adjustment of the mA and/or kV according to patient size and/or use of iterative reconstruction technique. COMPARISON:  07/03/2021 CT thoraco lumbar spine FINDINGS: CT THORACIC SPINE FINDINGS Alignment: No listhesis. Straightening of the normal thoracic kyphosis. Mild dextrocurvature of the upper thoracic spine levocurvature the lower thoracic spine and thoracolumbar junction. Vertebrae: No acute fracture or focal pathologic process. Paraspinal and other soft tissues: No focal pulmonary opacity, pleural effusion, or pneumothorax. Disc levels: No significant spinal canal stenosis. CT LUMBAR SPINE FINDINGS Segmentation: In counting from the first rib-bearing thoracic vertebral body, there are 13 rib-bearing vertebral bodies, the last of which is labeled T13. There are 5 lumbar type vertebral bodies, with the last fully formed disc space labeled L5-S1. Alignment: Mild dextrocurvature.  No significant listhesis. Vertebrae: No acute fracture or focal pathologic process. Chronic deformity of the left L2 transverse process is likely sequela remote trauma. Paraspinal and other soft tissues: Nonobstructing right renal calculus. Aortic atherosclerosis. Disc levels: Disc heights are relatively well preserved. Mild degenerative changes without high-grade spinal canal stenosis. IMPRESSION: 1. No acute fracture or traumatic listhesis in the thoracic or lumbar spine. 2. 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 Please correlate with imaging if any  intervention is planned. Electronically Signed   By: Wiliam Ke M.D.   On: 04/01/2023 20:08   CT Lumbar Spine Wo Contrast  Result Date: 04/01/2023 CLINICAL DATA:  Larey Seat 12 feet off ladder onto buttocks EXAM: CT THORACIC AND LUMBAR SPINE WITHOUT CONTRAST TECHNIQUE: Multidetector CT imaging of the thoracic and lumbar spine was performed without contrast. Multiplanar CT image reconstructions were also generated. RADIATION DOSE REDUCTION: This exam was performed according to the departmental dose-optimization program which includes automated exposure control, adjustment of the mA and/or kV according to patient size and/or use of iterative reconstruction technique. COMPARISON:  07/03/2021 CT thoraco lumbar spine FINDINGS: CT THORACIC SPINE FINDINGS Alignment: No listhesis. Straightening of the normal thoracic kyphosis. Mild dextrocurvature of the upper thoracic spine levocurvature the lower thoracic spine and thoracolumbar junction. Vertebrae: No acute fracture or focal pathologic process. Paraspinal and other soft tissues: No focal pulmonary opacity, pleural effusion, or pneumothorax. Disc levels: No significant spinal canal  stenosis. CT LUMBAR SPINE FINDINGS Segmentation: In counting from the first rib-bearing thoracic vertebral body, there are 13 rib-bearing vertebral bodies, the last of which is labeled T13. There are 5 lumbar type vertebral bodies, with the last fully formed disc space labeled L5-S1. Alignment: Mild dextrocurvature.  No significant listhesis. Vertebrae: No acute fracture or focal pathologic process. Chronic deformity of the left L2 transverse process is likely sequela remote trauma. Paraspinal and other soft tissues: Nonobstructing right renal calculus. Aortic atherosclerosis. Disc levels: Disc heights are relatively well preserved. Mild degenerative changes without high-grade spinal canal stenosis. IMPRESSION: 1. No acute fracture or traumatic listhesis in the thoracic or lumbar spine. 2.  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 Please correlate with imaging if any intervention is planned. Electronically Signed   By: Wiliam Ke M.D.   On: 04/01/2023 20:08    Procedures Procedures    Medications Ordered in ED Medications  ketorolac (TORADOL) 30 MG/ML injection 60 mg (60 mg Intramuscular Given 04/01/23 1630)  HYDROcodone-acetaminophen (NORCO/VICODIN) 5-325 MG per tablet 2 tablet (2 tablets Oral Given 04/01/23 1628)    ED Course/ Medical Decision Making/ A&P                                 Medical Decision Making Amount and/or Complexity of Data Reviewed Radiology: ordered.  Risk Prescription drug management.   Patient had a fall from height of 12 feet onto his buttocks.  He has risk for compression fractures or disc herniation in the lumbar or thoracic spine.  He has no neurologic dysfunction.  Will proceed with CT scans to rule out fractures.  Patient has no thoracic or abdominal pain.  Clinically he is 4 days out and no signs of intra-abdominal or thoracic injury that would necessitate CT imaging.  CT imaging interpreted by radiology does not show any acute fracture patterns.  Patient has been treated for pain with Toradol and Vicodin.  He is ambulatory and mobile.  At this time with reassessment I feel he is stable for discharge with return precautions and follow-up plan in place.  I reviewed findings and plan with the patient and his companion at bedside.        Final Clinical Impression(s) / ED Diagnoses Final diagnoses:  Lumbar back sprain, initial encounter  Strain of thoracic back region  Fall from ladder, initial encounter    Rx / DC Orders ED Discharge Orders          Ordered    oxyCODONE-acetaminophen (PERCOCET/ROXICET) 5-325 MG tablet  Every 6 hours PRN        04/01/23 2049    ibuprofen (ADVIL) 800 MG tablet  3 times daily        04/01/23 2049    methocarbamol (ROBAXIN) 500 MG tablet  Every 6 hours PRN        04/01/23  2049              Arby Barrette, MD 04/01/23 2056

## 2023-04-01 NOTE — ED Notes (Signed)
Discussed pt with Dr. Rush Landmark; no orders received at this time, wants to evaluated pt first
